# Patient Record
Sex: Female | Born: 1945
Health system: Southern US, Community
[De-identification: ages and names within clinical notes are randomized; demographics above are authoritative.]

## PROBLEM LIST (undated history)

## (undated) DIAGNOSIS — U071 COVID-19: Secondary | ICD-10-CM

## (undated) DIAGNOSIS — I639 Cerebral infarction, unspecified: Secondary | ICD-10-CM

## (undated) DIAGNOSIS — Z8719 Personal history of other diseases of the digestive system: Secondary | ICD-10-CM

## (undated) DIAGNOSIS — E785 Hyperlipidemia, unspecified: Secondary | ICD-10-CM

## (undated) DIAGNOSIS — E119 Type 2 diabetes mellitus without complications: Secondary | ICD-10-CM

## (undated) DIAGNOSIS — I5030 Unspecified diastolic (congestive) heart failure: Secondary | ICD-10-CM

## (undated) HISTORY — DX: Personal history of other diseases of the digestive system: Z87.19

## (undated) HISTORY — DX: Type 2 diabetes mellitus without complications: E11.9

## (undated) HISTORY — DX: Cerebral infarction, unspecified: I63.9

## (undated) HISTORY — DX: COVID-19: U07.1

## (undated) HISTORY — DX: Unspecified diastolic (congestive) heart failure: I50.30

## (undated) HISTORY — PX: TUBAL LIGATION: SHX77

## (undated) HISTORY — DX: Hyperlipidemia, unspecified: E78.5

---

## 2015-10-16 ENCOUNTER — Encounter (HOSPITAL_COMMUNITY): Payer: Self-pay | Admitting: *Deleted

## 2015-10-16 ENCOUNTER — Emergency Department (HOSPITAL_COMMUNITY)
Admission: EM | Admit: 2015-10-16 | Discharge: 2015-10-17 | Disposition: A | Discharge status: Home / Self Care | Payer: Medicare Other | Attending: Emergency Medicine | Admitting: Emergency Medicine

## 2015-10-16 ENCOUNTER — Emergency Department (HOSPITAL_COMMUNITY): Payer: Medicare Other

## 2015-10-16 DIAGNOSIS — Z8739 Personal history of other diseases of the musculoskeletal system and connective tissue: Secondary | ICD-10-CM | POA: Insufficient documentation

## 2015-10-16 DIAGNOSIS — R102 Pelvic and perineal pain: Secondary | ICD-10-CM | POA: Diagnosis present

## 2015-10-16 DIAGNOSIS — R1031 Right lower quadrant pain: Secondary | ICD-10-CM | POA: Insufficient documentation

## 2015-10-16 LAB — CBC
HEMATOCRIT: 36.4 % (ref 36.0–46.0)
Hemoglobin: 12 g/dL (ref 12.0–15.0)
MCH: 25.7 pg — ABNORMAL LOW (ref 26.0–34.0)
MCHC: 33 g/dL (ref 30.0–36.0)
MCV: 77.9 fL — AB (ref 78.0–100.0)
PLATELETS: 284 10*3/uL (ref 150–400)
RBC: 4.67 MIL/uL (ref 3.87–5.11)
RDW: 14.7 % (ref 11.5–15.5)
WBC: 6.7 10*3/uL (ref 4.0–10.5)

## 2015-10-16 LAB — I-STAT CHEM 8, ED
BUN: 16 mg/dL (ref 6–20)
CHLORIDE: 103 mmol/L (ref 101–111)
CREATININE: 1 mg/dL (ref 0.44–1.00)
Calcium, Ion: 1.19 mmol/L (ref 1.13–1.30)
GLUCOSE: 167 mg/dL — AB (ref 65–99)
HEMATOCRIT: 39 % (ref 36.0–46.0)
HEMOGLOBIN: 13.3 g/dL (ref 12.0–15.0)
POTASSIUM: 3.8 mmol/L (ref 3.5–5.1)
Sodium: 140 mmol/L (ref 135–145)
TCO2: 27 mmol/L (ref 0–100)

## 2015-10-16 LAB — URINALYSIS, ROUTINE W REFLEX MICROSCOPIC
BILIRUBIN URINE: NEGATIVE
GLUCOSE, UA: NEGATIVE mg/dL
HGB URINE DIPSTICK: NEGATIVE
KETONES UR: NEGATIVE mg/dL
Nitrite: NEGATIVE
PH: 5.5 (ref 5.0–8.0)
PROTEIN: NEGATIVE mg/dL
Specific Gravity, Urine: 1.025 (ref 1.005–1.030)

## 2015-10-16 LAB — BASIC METABOLIC PANEL
Anion gap: 8 (ref 5–15)
BUN: 14 mg/dL (ref 6–20)
CALCIUM: 8.9 mg/dL (ref 8.9–10.3)
CO2: 26 mmol/L (ref 22–32)
CREATININE: 0.91 mg/dL (ref 0.44–1.00)
Chloride: 104 mmol/L (ref 101–111)
GFR calc Af Amer: >60 mL/min (ref >60)
GLUCOSE: 171 mg/dL — AB (ref 65–99)
Potassium: 3.8 mmol/L (ref 3.5–5.1)
Sodium: 138 mmol/L (ref 135–145)

## 2015-10-16 LAB — URINE MICROSCOPIC-ADD ON: RBC / HPF: NONE SEEN RBC/hpf (ref 0–5)

## 2015-10-16 MED ORDER — IOPAMIDOL (ISOVUE-300) INJECTION 61%
INTRAVENOUS | Status: AC
  Administered 2015-10-16: 100 mL
  Filled 2015-10-16: qty 100

## 2015-10-16 NOTE — ED Notes (Addendum)
Pt here for pelvic pain x over a year.  No recent chang in this, no vaginal bleeding.

## 2015-10-17 DIAGNOSIS — R1031 Right lower quadrant pain: Secondary | ICD-10-CM | POA: Diagnosis not present

## 2015-10-17 NOTE — ED Provider Notes (Signed)
CSN: [id number]     Arrival date & time 10/16/15  1554 History   First MD Initiated Contact with Patient 10/16/15 2104     Chief Complaint  Patient presents with  . Pelvic Pain     (Consider location/radiation/quality/duration/timing/severity/associated sxs/prior Treatment) Patient is a 70 y.o. female presenting with pelvic pain.  Pelvic Pain Associated symptoms include abdominal pain. Pertinent negatives include no chest pain, fatigue, fever or nausea.  70 year old female presents complaining of right lower abdominal/hip pain. She says this pain has been ongoing for many years, but was worse today. She thinks it's related to helping her daughter move the other day. She denies any vaginal discharge, no dysuria, no generalized abdominal pain. She has had no constipation or diarrhea. She has had no blood in her stools. She has had no fever. She does say that she has pain in her right lower quadrant, worse with palpation. She says it is never hurt this badly before. 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  History reviewed. No pertinent past medical history. History reviewed. No pertinent past surgical history. No family history on file. Social History  Substance Use Topics  . Smoking status: Never Smoker   . Smokeless tobacco: None  . Alcohol Use: No   OB History    No data available     Review of Systems  Constitutional: Negative for fever and fatigue.  Respiratory: Negative for shortness of breath.   Cardiovascular: Negative for chest pain.  Gastrointestinal: Positive for abdominal pain. Negative for nausea, diarrhea, constipation, blood in stool and anal bleeding.  Genitourinary: Positive for pelvic pain. Negative for dysuria and flank pain.  All other systems reviewed and are negative.     Allergies  Review of patient's allergies indicates no  known allergies.  Home Medications   Prior to Admission medications   Not on File   BP 138/79 mmHg  Pulse 72  Temp(Src) 98.6 F (37 C) (Oral)  Resp 17  Wt 79.878 kg  SpO2 98% Physical Exam  Constitutional: She is oriented to person, place, and time. She appears well-developed and well-nourished. No distress.  HENT:  Head: Normocephalic and atraumatic.  Eyes: Conjunctivae are normal. Pupils are equal, round, and reactive to light.  Neck: Normal range of motion. Neck supple. No JVD present.  Cardiovascular: Regular rhythm.   Pulmonary/Chest: Effort normal and breath sounds normal. No respiratory distress. She has no rales.  Abdominal: Soft. Bowel sounds are normal. There is tenderness (right lower quadrant). There is no rebound and no guarding.  Musculoskeletal: She exhibits no edema.  Neurological: She is alert and oriented to person, place, and time. No cranial nerve deficit.  Skin: Skin is warm and dry.  Nursing note and vitals reviewed.   ED Course  Procedures (including critical care time) Labs Review Labs Reviewed  CBC - Abnormal; Notable for the following:    MCV 77.9 (*)    MCH 25.7 (*)    All other components within normal limits  BASIC METABOLIC PANEL - Abnormal; Notable for the following:    Glucose, Bld 171 (*)    All other components within normal limits  URINALYSIS, ROUTINE W REFLEX MICROSCOPIC (NOT AT [facility]) - Abnormal; Notable for the following:    APPearance CLOUDY (*)    Leukocytes, UA MODERATE (*)    All other components within normal limits  URINE MICROSCOPIC-ADD ON - Abnormal; Notable for the following:    Squamous Epithelial / LPF 0-5 (*)    Bacteria, UA RARE (*)    All other components within normal limits  I-STAT CHEM 8, ED - Abnormal; Notable for the following:    Glucose, Bld 167 (*)    All other components within normal limits    Imaging Review Ct Abdomen Pelvis W Contrast  10/17/2015  CLINICAL DATA:  Right lower quadrant pain. Pelvic  pain for over a year. EXAM: CT ABDOMEN AND PELVIS WITH CONTRAST TECHNIQUE: Multidetector CT imaging of the abdomen and pelvis was performed using the standard protocol following bolus administration of intravenous contrast. CONTRAST:  175mL ISOVUE-300 IOPAMIDOL (ISOVUE-300) INJECTION 61% COMPARISON:  None. FINDINGS: Mild atelectasis in the lung bases. The liver, spleen, gallbladder, pancreas, adrenal glands, kidneys, abdominal aorta, inferior vena cava, and retroperitoneal lymph nodes are unremarkable. Stomach and small bowel are decompressed. Scattered stool in the colon without abnormal distention. No free air or free fluid in the abdomen. Pelvis: Appendix is normal. Uterus and ovaries are not enlarged. Bladder wall is not thickened. No free or loculated fluid collections. No pelvic mass or lymphadenopathy. Degenerative changes in the spine and hips. No destructive bone lesions. IMPRESSION: No acute process demonstrated in the abdomen or pelvis. Electronically Signed   By: Lucienne Capers M.D.   On: 10/17/2015 00:49   I have personally reviewed and evaluated these images and lab results as part of my medical decision-making.   EKG Interpretation None      MDM   Final diagnoses:  Right lower quadrant abdominal pain    Patient presents with right lower quadrant abdominal pain and tenderness. Concern for acute abdominal emergency such as appendicitis, though this is less likely given her history. Lab work reviewed, no leukocytosis, transaminitis, metabolic acidosis to suggest intra-abdominal processes. CT obtained and again does not demonstrate any intra-abdominal emergency. Due to chronicity of the pain, low suspicion for serious emergency at this point. Encouraged her to follow up with her primary care doctor. Patient is discharged home him all questions answered, comfortable with plan. Plan was also discussed with daughter.    Leata Mouse, MD 10/17/15 0100  Davonna Belling, MD 10/17/15  2234

## 2015-10-17 NOTE — Discharge Instructions (Signed)

## 2015-11-19 ENCOUNTER — Ambulatory Visit (INDEPENDENT_AMBULATORY_CARE_PROVIDER_SITE_OTHER): Payer: Medicare Other | Admitting: Internal Medicine

## 2015-11-19 ENCOUNTER — Encounter: Payer: Self-pay | Admitting: Internal Medicine

## 2015-11-19 ENCOUNTER — Ambulatory Visit (INDEPENDENT_AMBULATORY_CARE_PROVIDER_SITE_OTHER)
Admission: RE | Admit: 2015-11-19 | Discharge: 2015-11-19 | Disposition: A | Discharge status: Home / Self Care | Payer: Medicare Other | Source: Ambulatory Visit | Attending: Internal Medicine | Admitting: Internal Medicine

## 2015-11-19 VITALS — BP 122/88 | HR 80 | Ht 62.0 in | Wt 176.2 lb

## 2015-11-19 DIAGNOSIS — R0789 Other chest pain: Secondary | ICD-10-CM

## 2015-11-19 DIAGNOSIS — J9811 Atelectasis: Secondary | ICD-10-CM | POA: Diagnosis not present

## 2015-11-19 DIAGNOSIS — E119 Type 2 diabetes mellitus without complications: Secondary | ICD-10-CM

## 2015-11-19 DIAGNOSIS — R079 Chest pain, unspecified: Secondary | ICD-10-CM

## 2015-11-19 DIAGNOSIS — R05 Cough: Secondary | ICD-10-CM | POA: Diagnosis not present

## 2015-11-19 HISTORY — DX: Type 2 diabetes mellitus without complications: E11.9

## 2015-11-19 NOTE — Progress Notes (Signed)
Subjective:    Patient ID: Diana Elliott, female    DOB: 19-Nov-1945,    MRN: [id number]  HPI  40 yobf never smoker with intermittent cp x spring/summer 2016 > ER  10/16/15 with only atx in bases on CT abd/pelvis so rec f/u with primary care but self referred to pulmonary clinic 11/20/2015 due to concern re dx of atx.   11/19/2015 1st [facility] office visit/ Diana Elliott   Chief Complaint  Patient presents with  . Pulmonary Consult    Self referral for abn ct abd/pelvis. Pt seen in the ED on 10/16/15 for pelvic pain and ct showed atelectasis. She c/o occ dysphagia.   RLQ pain has been present daily for over a year, resolves in supine position, ? Better with advil, assoc with dysphagia > chest tight/ better if swallows lots of water with food  ? If this was after she started taking nsaids for the RLQ pain which is worse when she's on her feet all day.   No obvious day to day or daytime variability or assoc sob or  chronic cough or exertiional or pleuritic  cp  subjective wheeze or overt sinus or hb symptoms. No unusual exp hx or h/o childhood pna/ asthma or knowledge of premature birth.  Sleeping ok without nocturnal  or early am exacerbation  of respiratory  c/o's or need for noct saba. Also denies any obvious fluctuation of symptoms with weather or environmental changes or other aggravating or alleviating factors except as outlined above   Current Medications, Allergies, Complete Past Medical History, Past Surgical History, Family History, and Social History were reviewed in [employer] record.                  Review of Systems  Constitutional: Negative for fever, chills and unexpected weight change.  HENT: Positive for trouble swallowing. Negative for congestion, dental problem, ear pain, nosebleeds, postnasal drip, rhinorrhea, sinus pressure, sneezing, sore throat and voice change.   Eyes: Negative for visual disturbance.  Respiratory: Negative for cough,  choking and shortness of breath.   Cardiovascular: Negative for chest pain and leg swelling.  Gastrointestinal: Negative for vomiting, abdominal pain and diarrhea.       Indigestion  Genitourinary: Negative for difficulty urinating.  Musculoskeletal: Negative for arthralgias.  Skin: Negative for rash.  Neurological: Negative for tremors, syncope and headaches.  Hematological: Does not bruise/bleed easily.       Objective:   Physical Exam   amb bf nad   Wt Readings from Last 3 Encounters:  11/19/15 176 lb 3.2 oz (79.924 kg)  10/16/15 176 lb 1.6 oz (79.878 kg)    Vital signs reviewed   HEENT: nl dentition, turbinates, and oropharynx. Nl external ear canals without cough reflex   NECK :  without JVD/Nodes/TM/ nl carotid upstrokes bilaterally   LUNGS: no acc muscle use,  Nl contour chest which is clear to A and P bilaterally without cough on insp or exp maneuvers   CV:  RRR  no s3 or murmur or increase in P2, no edema   ABD:  soft and nontender with nl inspiratory excursion in the supine position. No bruits or organomegaly, bowel sounds nl  MS:  Nl gait/ ext warm without deformities, calf tenderness, cyanosis or clubbing No obvious joint restrictions   SKIN: warm and dry without lesions    NEURO:  alert, approp, nl sensorium with  no motor deficits     CXR PA and Lateral:   11/19/2015 :  I personally reviewed images and agree with radiology impression as follows:   No active cardiopulmonary disease         Assessment & Plan:

## 2015-11-19 NOTE — Patient Instructions (Addendum)
Try prilosec otc 20mg   Take 30-60 min before first meal of the day and Pepcid ac (famotidine) 20 mg one @  bedtime for a month to see if helps and if not you will need to see a GI doctor call for referral)  Treatment also consists of avoiding foods that cause gas (especially [place] food, boiled eggs, beans and raw vegetables like spinach and salads)  and citrucel 1 heaping tsp twice daily with a large glass of water.  Pain should improve w/in 2 weeks and if not then consider further GI work up.      GERD (REFLUX)  is an extremely common cause of respiratory symptoms just like yours , many times with no obvious heartburn at all.    It can be treated with medication, but also with lifestyle changes including elevation of the head of your bed (ideally with 6 inch  bed blocks),  Smoking cessation, avoidance of late meals, excessive alcohol, and avoid fatty foods, chocolate, peppermint, colas, red wine, and acidic juices such as orange juice.  NO MINT OR MENTHOL PRODUCTS SO NO COUGH DROPS  USE SUGARLESS CANDY INSTEAD (Jolley ranchers or Stover's or Life Savers) or even ice chips will also do - the key is to swallow to prevent all throat clearing. NO OIL BASED VITAMINS - use powdered substitutes.   Try tylenol for your abdominal pain and then advil if needed but take with meals   Please remember to go to the  x-ray department downstairs for your tests - we will call you with the results when they are available.    Please see patient coordinator before you leave today  to schedule internal medicine referral

## 2015-11-20 DIAGNOSIS — J9811 Atelectasis: Secondary | ICD-10-CM | POA: Insufficient documentation

## 2015-11-20 NOTE — Assessment & Plan Note (Signed)
Probably related to mod obesity with Body mass index is 32.22 > referred to primary care

## 2015-11-20 NOTE — Assessment & Plan Note (Addendum)
Assoc with dysphagia and may be result of chronic use of nsaids or related to IBS so rec trial of ppi q am and h2hs / gerd and gas diet and f/u prn with GI pending establishing with primary care  Nothing at all on hx to suggest IHD as does not related to activity but rather to meals and not lateralizing or pleuritic to suggest a lung problem but certainly if does not resolve with rx as per avs will need further w/u  Discussed in detail all the  indications, usual  risks and alternatives  relative to the benefits with patient/daughter who agree  to proceed with conservative f/u as outlined in the AVS

## 2015-11-20 NOTE — Assessment & Plan Note (Signed)
CT abd and present cxr reviewed and do not show significant atx which was likely just the result of lyinging in supine position in the case of the former study and does not required pulmonary f/u  Total time devoted to counseling  = 35/36m review case with pt/daughter discussion of options/alternatives/ personally creating written instructions  in presence of pt  then going over those specific  Instructions directly with the pt including how to use all of the meds but in particular covering each new medication in detail and the difference between the maintenance/automatic meds and the prns using an action plan format for the latter.

## 2015-12-06 DIAGNOSIS — Z1231 Encounter for screening mammogram for malignant neoplasm of breast: Secondary | ICD-10-CM | POA: Diagnosis not present

## 2015-12-06 DIAGNOSIS — Z124 Encounter for screening for malignant neoplasm of cervix: Secondary | ICD-10-CM | POA: Diagnosis not present

## 2015-12-07 ENCOUNTER — Other Ambulatory Visit: Payer: Self-pay | Admitting: Obstetrics & Gynecology

## 2015-12-07 DIAGNOSIS — R928 Other abnormal and inconclusive findings on diagnostic imaging of breast: Secondary | ICD-10-CM

## 2015-12-14 ENCOUNTER — Other Ambulatory Visit: Payer: Medicare Other

## 2015-12-17 ENCOUNTER — Ambulatory Visit
Admission: RE | Admit: 2015-12-17 | Discharge: 2015-12-17 | Disposition: A | Discharge status: Home / Self Care | Payer: Medicare Other | Source: Ambulatory Visit | Attending: Obstetrics & Gynecology | Admitting: Obstetrics & Gynecology

## 2015-12-17 DIAGNOSIS — R928 Other abnormal and inconclusive findings on diagnostic imaging of breast: Secondary | ICD-10-CM

## 2015-12-17 DIAGNOSIS — N6489 Other specified disorders of breast: Secondary | ICD-10-CM | POA: Diagnosis not present

## 2015-12-17 DIAGNOSIS — N63 Unspecified lump in breast: Secondary | ICD-10-CM | POA: Diagnosis not present

## 2016-01-30 ENCOUNTER — Ambulatory Visit: Payer: Medicare Other | Admitting: Internal Medicine

## 2017-03-16 DIAGNOSIS — Z1231 Encounter for screening mammogram for malignant neoplasm of breast: Secondary | ICD-10-CM | POA: Diagnosis not present

## 2017-03-16 DIAGNOSIS — Z124 Encounter for screening for malignant neoplasm of cervix: Secondary | ICD-10-CM | POA: Diagnosis not present

## 2017-03-19 ENCOUNTER — Other Ambulatory Visit: Payer: Self-pay | Admitting: Obstetrics & Gynecology

## 2017-03-19 DIAGNOSIS — E2839 Other primary ovarian failure: Secondary | ICD-10-CM

## 2018-05-17 DIAGNOSIS — Z1231 Encounter for screening mammogram for malignant neoplasm of breast: Secondary | ICD-10-CM | POA: Diagnosis not present

## 2018-05-17 DIAGNOSIS — Z124 Encounter for screening for malignant neoplasm of cervix: Secondary | ICD-10-CM | POA: Diagnosis not present

## 2018-05-17 DIAGNOSIS — Z01419 Encounter for gynecological examination (general) (routine) without abnormal findings: Secondary | ICD-10-CM | POA: Diagnosis not present

## 2018-05-21 DIAGNOSIS — F419 Anxiety disorder, unspecified: Secondary | ICD-10-CM | POA: Diagnosis not present

## 2018-05-21 DIAGNOSIS — E785 Hyperlipidemia, unspecified: Secondary | ICD-10-CM | POA: Diagnosis not present

## 2018-05-21 DIAGNOSIS — R7309 Other abnormal glucose: Secondary | ICD-10-CM | POA: Diagnosis not present

## 2018-05-21 DIAGNOSIS — J309 Allergic rhinitis, unspecified: Secondary | ICD-10-CM | POA: Diagnosis not present

## 2018-09-14 ENCOUNTER — Encounter: Payer: Self-pay | Admitting: Gastroenterology

## 2019-07-06 ENCOUNTER — Other Ambulatory Visit (INDEPENDENT_AMBULATORY_CARE_PROVIDER_SITE_OTHER): Payer: Medicare Other

## 2019-07-06 ENCOUNTER — Ambulatory Visit (INDEPENDENT_AMBULATORY_CARE_PROVIDER_SITE_OTHER): Payer: Medicare Other | Admitting: Gastroenterology

## 2019-07-06 ENCOUNTER — Encounter: Payer: Self-pay | Admitting: Gastroenterology

## 2019-07-06 VITALS — BP 132/80 | HR 72 | Temp 97.4°F | Ht 62.0 in | Wt 169.8 lb

## 2019-07-06 DIAGNOSIS — R634 Abnormal weight loss: Secondary | ICD-10-CM

## 2019-07-06 DIAGNOSIS — K625 Hemorrhage of anus and rectum: Secondary | ICD-10-CM

## 2019-07-06 DIAGNOSIS — R630 Anorexia: Secondary | ICD-10-CM

## 2019-07-06 DIAGNOSIS — Z1159 Encounter for screening for other viral diseases: Secondary | ICD-10-CM | POA: Diagnosis not present

## 2019-07-06 DIAGNOSIS — R109 Unspecified abdominal pain: Secondary | ICD-10-CM | POA: Diagnosis not present

## 2019-07-06 DIAGNOSIS — K5900 Constipation, unspecified: Secondary | ICD-10-CM | POA: Diagnosis not present

## 2019-07-06 DIAGNOSIS — K602 Anal fissure, unspecified: Secondary | ICD-10-CM

## 2019-07-06 LAB — CBC WITH DIFFERENTIAL/PLATELET
Basophils Absolute: 0.1 10*3/uL (ref 0.0–0.1)
Basophils Relative: 0.9 % (ref 0.0–3.0)
Eosinophils Absolute: 0.2 10*3/uL (ref 0.0–0.7)
Eosinophils Relative: 2.7 % (ref 0.0–5.0)
HCT: 39.1 % (ref 36.0–46.0)
Hemoglobin: 12.5 g/dL (ref 12.0–15.0)
Lymphocytes Relative: 23.6 % (ref 12.0–46.0)
Lymphs Abs: 1.3 10*3/uL (ref 0.7–4.0)
MCHC: 32.1 g/dL (ref 30.0–36.0)
MCV: 78.6 fl (ref 78.0–100.0)
Monocytes Absolute: 0.4 10*3/uL (ref 0.1–1.0)
Monocytes Relative: 7.3 % (ref 3.0–12.0)
Neutro Abs: 3.7 10*3/uL (ref 1.4–7.7)
Neutrophils Relative %: 65.5 % (ref 43.0–77.0)
Platelets: 320 10*3/uL (ref 150.0–400.0)
RBC: 4.97 Mil/uL (ref 3.87–5.11)
RDW: 14.9 % (ref 11.5–15.5)
WBC: 5.7 10*3/uL (ref 4.0–10.5)

## 2019-07-06 LAB — BASIC METABOLIC PANEL
BUN: 10 mg/dL (ref 6–23)
CO2: 29 mEq/L (ref 19–32)
Calcium: 9.2 mg/dL (ref 8.4–10.5)
Chloride: 103 mEq/L (ref 96–112)
Creatinine, Ser: 0.82 mg/dL (ref 0.40–1.20)
GFR: 82.49 mL/min (ref >60.00)
Glucose, Bld: 198 mg/dL — ABNORMAL HIGH (ref 70–99)
Potassium: 3.8 mEq/L (ref 3.5–5.1)
Sodium: 138 mEq/L (ref 135–145)

## 2019-07-06 MED ORDER — AMBULATORY NON FORMULARY MEDICATION: 1 refills | Status: AC

## 2019-07-06 MED ORDER — SUPREP BOWEL PREP KIT 17.5-3.13-1.6 GM/177ML PO SOLN: ORAL | 0 refills | Status: DC

## 2019-07-06 NOTE — Patient Instructions (Addendum)
If you are age 74 or older, your body mass index should be between 23-30. Your Body mass index is 31.06 kg/m. If this is out of the aforementioned range listed, please consider follow up with your Primary Care Provider.  If you are age 61 or younger, your body mass index should be between 19-25. Your Body mass index is 31.06 kg/m. If this is out of the aformentioned range listed, please consider follow up with your Primary Care Provider.   Go to the basement today for labs    You have been scheduled for a colonoscopy. Please follow written instructions given to you at your visit today.  Please pick up your prep supplies at the pharmacy within the next 1-3 days. If you use inhalers (even only as needed), please bring them with you on the day of your procedure.   We have sent a prescription for nitroglycerin 0.125% gel to [facility]. You should apply a pea size amount to your rectum three times daily x 6-8 weeks.  [facility]'s information is below: Address: [address] [place], [place] [address]  [phone]  *Please DO NOT go directly from our office to pick up this medication! Give the pharmacy 1 day to process the prescription as this is compounded and takes time to make.  Increase water intake to 8 cups per day   I appreciate the  opportunity to care for you  Thank You   Harl Bowie , MD

## 2019-07-06 NOTE — Progress Notes (Signed)
Diana Elliott    [id number]    1946/04/29  Primary Care Physician:Patient, No Pcp Per  Referring Physician: Dr Genia Harold   Chief complaint: Hemorrhoids, constipation  HPI:  74 yr F here for new patient visit with complaints of worsening constipation in the past 1 to 2 months associated with intermittent rectal bleeding.  She she is having increased blood per rectum since last Friday and is seeing blood with every bowel movement.  Also complains of lower abdominal pain.  She is currently not on any laxatives, has tried over-the-counter stool softener with no improvement.  She has to strain excessively and has difficulty evacuating.  On average 1-2 bowel movements a week.  Denies any change in diet or new medications.  No family history of colon cancer or IBD. She started taking ibuprofen as needed for lower abdominal pain but since she developed increased rectal bleeding she has discontinued it.  Colonoscopy in 2005, [place] per patient, report is not available to review during this visit.  Patient does not recall if she ever had polyps or not   CT abdomen and pelvis with contrast: 10/16/2015 Unremarkable   Outpatient Encounter Medications as of 07/06/2019  Medication Sig  . b complex vitamins tablet Take 1 tablet by mouth daily.  Marland Kitchen BLACK CURRANT SEED OIL PO Take 1 capsule by mouth daily.  . Cholecalciferol (VITAMIN D PO) Take 1 tablet by mouth daily.  . Omega-3 Fatty Acids (OMEGA 3 PO) Take 1 capsule by mouth daily.  . TURMERIC PO Take 1 capsule by mouth daily.  . AMBULATORY NON FORMULARY MEDICATION Medication Name: Nitroglycerin ointment 0.125% use pea sized amount 4 times a day per rectum  I  . Na Sulfate-K Sulfate-Mg Sulf (SUPREP BOWEL PREP KIT) 17.5-3.13-1.6 GM/177ML SOLN 1 prep kit   No facility-administered encounter medications on file as of 07/06/2019.    Allergies as of 07/06/2019  . (No Known Allergies)    History reviewed. No pertinent past  medical history.  Past Surgical History:  Procedure Laterality Date  . TUBAL LIGATION      History reviewed. No pertinent family history.  Social History   Socioeconomic History  . Marital status: Widowed    Spouse name: Not on file  . Number of children: Not on file  . Years of education: Not on file  . Highest education level: Not on file  Occupational History  . Not on file  Tobacco Use  . Smoking status: Never Smoker  . Smokeless tobacco: Never Used  Substance and Sexual Activity  . Alcohol use: No    Alcohol/week: 0.0 standard drinks  . Drug use: No  . Sexual activity: Not on file  Other Topics Concern  . Not on file  Social History Narrative  . Not on file   Social Determinants of Health   Financial Resource Strain:   . Difficulty of Paying Living Expenses: Not on file  Food Insecurity:   . Worried About Charity fundraiser in the Last Year: Not on file  . Ran Out of Food in the Last Year: Not on file  Transportation Needs:   . Lack of Transportation (Medical): Not on file  . Lack of Transportation (Non-Medical): Not on file  Physical Activity:   . Days of Exercise per Week: Not on file  . Minutes of Exercise per Session: Not on file  Stress:   . Feeling of Stress : Not on file  Social Connections:   . Frequency of Communication with Friends and Family: Not on file  . Frequency of Social Gatherings with Friends and Family: Not on file  . Attends Religious Services: Not on file  . Active Member of Clubs or Organizations: Not on file  . Attends Archivist Meetings: Not on file  . Marital Status: Not on file  Intimate Partner Violence:   . Fear of Current or Ex-Partner: Not on file  . Emotionally Abused: Not on file  . Physically Abused: Not on file  . Sexually Abused: Not on file      Review of systems: Review of Systems  Constitutional: Negative for fever and chills.  Positive for weight loss and decreased appetite HENT: Negative.     Eyes: Negative for blurred vision.  Respiratory: Negative for cough, shortness of breath and wheezing.   Cardiovascular: Negative for chest pain and palpitations.  Gastrointestinal: as per HPI Genitourinary: Negative for dysuria, urgency, frequency and hematuria.  Musculoskeletal: Positive for myalgias, back pain and joint pain.  Skin: Negative for itching and rash.  Neurological: Negative for dizziness, tremors, focal weakness, seizures and loss of consciousness.  Endo/Heme/Allergies: Negative Psychiatric/Behavioral: Negative for depression, suicidal ideas and hallucinations.  All other systems reviewed and are negative.   Physical Exam: Vitals:   07/06/19 1105  BP: 132/80  Pulse: 72  Temp: (!) 97.4 F (36.3 C)   Body mass index is 31.06 kg/m. Gen:      No acute distress HEENT:  EOMI, sclera anicteric Neck:     No masses; no thyromegaly Lungs:    Clear to auscultation bilaterally; normal respiratory effort CV:         Regular rate and rhythm; no murmurs Abd:      + bowel sounds; soft, non-tender; no palpable masses, no distension Ext:    No edema; adequate peripheral perfusion Skin:      Warm and dry; no rash Neuro: alert and oriented x 3 Psych: normal mood and affect Rectal exam: Normal anal sphincter tone, +anal fissure with tenderness in posterior position.  No external hemorrhoids   Data Reviewed:  Reviewed labs, radiology imaging, old records and pertinent past GI work up   Assessment and Plan/Recommendations:  74 year old female with type 2 diabetes here for evaluation of recent onset worsening constipation and rectal bleeding  Small posterior anal fissure on rectal exam Start MiraLAX 1 capful daily Nitroglycerin 0.125% per rectum, small pea-sized amount 3-4 times a day for 6 to 8 weeks  Given she is past due for colorectal cancer screening and recent significant change in bowel habits associated with weight loss and decreased appetite, will proceed with  colonoscopy to further evaluate and exclude neoplastic lesion  Increase dietary fiber and fluid intake  Return after colonoscopy    K. Denzil Magnuson , MD    CC: No ref. provider found

## 2019-07-07 ENCOUNTER — Ambulatory Visit (INDEPENDENT_AMBULATORY_CARE_PROVIDER_SITE_OTHER): Payer: Medicare Other

## 2019-07-07 ENCOUNTER — Other Ambulatory Visit: Payer: Self-pay | Admitting: Gastroenterology

## 2019-07-07 DIAGNOSIS — Z1159 Encounter for screening for other viral diseases: Secondary | ICD-10-CM

## 2019-07-08 LAB — SARS CORONAVIRUS 2 (TAT 6-24 HRS): SARS Coronavirus 2: NEGATIVE

## 2019-07-11 ENCOUNTER — Ambulatory Visit (AMBULATORY_SURGERY_CENTER): Payer: Medicare Other | Admitting: Gastroenterology

## 2019-07-11 ENCOUNTER — Other Ambulatory Visit: Payer: Self-pay

## 2019-07-11 ENCOUNTER — Encounter: Payer: Self-pay | Admitting: Gastroenterology

## 2019-07-11 VITALS — BP 157/87 | HR 75 | Temp 98.4°F | Resp 17 | Ht 62.0 in | Wt 169.0 lb

## 2019-07-11 DIAGNOSIS — D124 Benign neoplasm of descending colon: Secondary | ICD-10-CM | POA: Diagnosis not present

## 2019-07-11 DIAGNOSIS — K625 Hemorrhage of anus and rectum: Secondary | ICD-10-CM

## 2019-07-11 DIAGNOSIS — K5730 Diverticulosis of large intestine without perforation or abscess without bleeding: Secondary | ICD-10-CM | POA: Diagnosis not present

## 2019-07-11 DIAGNOSIS — D120 Benign neoplasm of cecum: Secondary | ICD-10-CM | POA: Diagnosis not present

## 2019-07-11 DIAGNOSIS — K602 Anal fissure, unspecified: Secondary | ICD-10-CM | POA: Diagnosis not present

## 2019-07-11 MED ORDER — SODIUM CHLORIDE 0.9 % IV SOLN: 500.0000 mL | Freq: Once | INTRAVENOUS | Status: DC

## 2019-07-11 MED ORDER — HYDROCORTISONE ACETATE 25 MG RE SUPP: 25.0000 mg | RECTAL | 0 refills | Status: AC

## 2019-07-11 NOTE — Op Note (Signed)
[place] Patient Name: Diana Elliott Procedure Date: 07/11/2019 1:14 PM MRN: [id number] Endoscopist: Mauri Pole , MD Age: 74 Referring MD:  Date of Birth: [birth year] Gender: Female Account #: [id number] Procedure:                Colonoscopy Indications:              Evaluation of unexplained GI bleeding presenting                            with Hematochezia Medicines:                Monitored Anesthesia Care Procedure:                Pre-Anesthesia Assessment:                           - Prior to the procedure, a History and Physical                            was performed, and patient medications and                            allergies were reviewed. The patient's tolerance of                            previous anesthesia was also reviewed. The risks                            and benefits of the procedure and the sedation                            options and risks were discussed with the patient.                            All questions were answered, and informed consent                            was obtained. Prior Anticoagulants: The patient has                            taken no previous anticoagulant or antiplatelet                            agents. ASA Grade Assessment: II - A patient with                            mild systemic disease. After reviewing the risks                            and benefits, the patient was deemed in                            satisfactory condition to undergo the procedure.  After obtaining informed consent, the colonoscope                            was passed under direct vision. Throughout the                            procedure, the patient's blood pressure, pulse, and                            oxygen saturations were monitored continuously. The                            Colonoscope was introduced through the anus and                            advanced to the the cecum, identified  by                            appendiceal orifice and ileocecal valve. The                            colonoscopy was performed without difficulty. The                            patient tolerated the procedure well. The quality                            of the bowel preparation was excellent. The                            ileocecal valve, appendiceal orifice, and rectum                            were photographed. Scope In: 1:16:04 PM Scope Out: 1:31:58 PM Scope Withdrawal Time: 0 hours 12 minutes 19 seconds  Total Procedure Duration: 0 hours 15 minutes 54 seconds  Findings:                 The perianal and digital rectal examinations were                            normal.                           Two sessile polyps were found in the cecum. The                            polyps were 1 to 2 mm in size. These polyps were                            removed with a cold biopsy forceps. Resection and                            retrieval were complete.  A 7 mm polyp was found in the descending colon. The                            polyp was sessile. The polyp was removed with a                            cold snare. Resection and retrieval were complete.                           Scattered small-mouthed diverticula were found in                            the sigmoid colon and descending colon.                           An area of moderately congested erythematous mucosa                            with altered vasculature was found in the rectum.                            Biopsies were taken with a cold forceps for                            histology.                           A less than 1 mm anal fissure was found in the anal                            canal. Complications:            No immediate complications. Estimated Blood Loss:     Estimated blood loss was minimal. Impression:               - Two 1 to 2 mm polyps in the cecum, removed with a                             cold biopsy forceps. Resected and retrieved.                           - One 7 mm polyp in the descending colon, removed                            with a cold snare. Resected and retrieved.                           - Mild diverticulosis in the sigmoid colon and in                            the descending colon.                           - Congested mucosa in the rectum. Biopsied.                           -  Anal fissure. Recommendation:           - Patient has a contact number available for                            emergencies. The signs and symptoms of potential                            delayed complications were discussed with the                            patient. Return to normal activities tomorrow.                            Written discharge instructions were provided to the                            patient.                           - Resume previous diet.                           - Continue present medications.                           - Await pathology results.                           - Use hydrocortisone suppository 25 mg 1 per rectum                            once a day at bedtime for 2 weeks.                           - Return to my office in 4 weeks, next available                            appointment. Mauri Pole, MD 07/11/2019 1:38:44 PM This report has been signed electronically.

## 2019-07-11 NOTE — Patient Instructions (Signed)
Information on polyps and diverticulosis given to you today.  Await pathology results.  Use hydrocortisone suppository 25 mg, one per rectum once a day at bedtime for 2 weeks.  Return to GI clinic in 4 weeks or next available appointment.  YOU HAD AN ENDOSCOPIC PROCEDURE TODAY AT [facility]:   Refer to the procedure report that was given to you for any specific questions about what was found during the examination.  If the procedure report does not answer your questions, please call your gastroenterologist to clarify.  If you requested that your care partner not be given the details of your procedure findings, then the procedure report has been included in a sealed envelope for you to review at your convenience later.  YOU SHOULD EXPECT: Some feelings of bloating in the abdomen. Passage of more gas than usual.  Walking can help get rid of the air that was put into your GI tract during the procedure and reduce the bloating. If you had a lower endoscopy (such as a colonoscopy or flexible sigmoidoscopy) you may notice spotting of blood in your stool or on the toilet paper. If you underwent a bowel prep for your procedure, you may not have a normal bowel movement for a few days.  Please Note:  You might notice some irritation and congestion in your nose or some drainage.  This is from the oxygen used during your procedure.  There is no need for concern and it should clear up in a day or so.  SYMPTOMS TO REPORT IMMEDIATELY:   Following lower endoscopy (colonoscopy or flexible sigmoidoscopy):  Excessive amounts of blood in the stool  Significant tenderness or worsening of abdominal pains  Swelling of the abdomen that is new, acute  Fever of 100F or higher    For urgent or emergent issues, a gastroenterologist can be reached at any hour by calling [phone].   DIET:  We do recommend a small meal at first, but then you may proceed to your regular diet.  Drink plenty of  fluids but you should avoid alcoholic beverages for 24 hours.  ACTIVITY:  You should plan to take it easy for the rest of today and you should NOT DRIVE or use heavy machinery until tomorrow (because of the sedation medicines used during the test).    FOLLOW UP: Our staff will call the number listed on your records 48-72 hours following your procedure to check on you and address any questions or concerns that you may have regarding the information given to you following your procedure. If we do not reach you, we will leave a message.  We will attempt to reach you two times.  During this call, we will ask if you have developed any symptoms of COVID 19. If you develop any symptoms (ie: fever, flu-like symptoms, shortness of breath, cough etc.) before then, please call [phone].  If you test positive for Covid 19 in the 2 weeks post procedure, please call and report this information to [place].    If any biopsies were taken you will be contacted by phone or by letter within the next 1-3 weeks.  Please call us at [phone] if you have not heard about the biopsies in 3 weeks.    SIGNATURES/CONFIDENTIALITY: You and/or your care partner have signed paperwork which will be entered into your electronic medical record.  These signatures attest to the fact that that the information above on your After Visit Summary has been reviewed and is  understood.  Full responsibility of the confidentiality of this discharge information lies with you and/or your care-partner.

## 2019-07-11 NOTE — Progress Notes (Signed)
Called to room to assist during endoscopic procedure.  Patient ID and intended procedure confirmed with present staff. Received instructions for my participation in the procedure from the performing physician.  

## 2019-07-11 NOTE — Progress Notes (Signed)
Pt's states no medical or surgical changes since previsit or office visit.  Temp JB VS CW 

## 2019-07-11 NOTE — Progress Notes (Signed)
Report to PACU, RN, vss, BBS= Clear.  

## 2019-07-12 ENCOUNTER — Telehealth: Payer: Self-pay

## 2019-07-12 NOTE — Telephone Encounter (Signed)
Patient needs a follow up appointment with Dr Silverio Decamp. Status post colonoscopy for hematochezia. Call to patient's phone. No answer. Left a message about the reason for the call and a request she call us back to schedule the follow up appointment.

## 2019-07-13 ENCOUNTER — Telehealth: Payer: Self-pay

## 2019-07-13 NOTE — Telephone Encounter (Signed)
Called patient back and she reported same information that her daughter had previously said this morning. She just wanted to let us know that she was doing fine and would let us know if there were any issues.

## 2019-07-13 NOTE — Telephone Encounter (Signed)
Pt stated that you spoke to her daughter this morning and requested a call back to follow up.

## 2019-07-13 NOTE — Telephone Encounter (Signed)
  Follow up Call-  Call back number 07/11/2019  Post procedure Call Back phone  # [phone] [facility])  Permission to leave phone message Yes  Some recent data might be hidden     Patient questions:  Do you have a fever, pain , or abdominal swelling? No. Pain Score  0 *  Have you tolerated food without any problems? Yes.    Have you been able to return to your normal activities? Yes.    Do you have any questions about your discharge instructions: Diet   No. Medications  No. Follow up visit  No.  Do you have questions or concerns about your Care? No.  Actions: * If pain score is 4 or above: No action needed, pain <4. 1. Have you developed a fever since your procedure? no  2.   Have you had an respiratory symptoms (SOB or cough) since your procedure? no  3.   Have you tested positive for COVID 19 since your procedure no  4.   Have you had any family members/close contacts diagnosed with the COVID 19 since your procedure?  no   If yes to any of these questions please route to Joylene John, RN and Alphonsa Gin, Therapist, sports.

## 2019-07-18 ENCOUNTER — Telehealth: Payer: Self-pay | Admitting: *Deleted

## 2019-07-18 NOTE — Telephone Encounter (Signed)
Anusol suppositories are not covered under patient's insurance plan. I have left a message for patient to call back so I can advise her to purchase OTC hydrocortisone and glycerin suppositories instead.

## 2019-07-19 ENCOUNTER — Encounter: Payer: Self-pay | Admitting: Gastroenterology

## 2019-07-20 NOTE — Telephone Encounter (Signed)
Left message for patient to call back  

## 2019-07-21 NOTE — Telephone Encounter (Signed)
Patient and her daughter indicate that they purchase the suppositories in "singles" form so no need to do over the counter plan

## 2019-07-26 ENCOUNTER — Other Ambulatory Visit: Payer: Self-pay | Admitting: Obstetrics & Gynecology

## 2019-07-26 DIAGNOSIS — E2839 Other primary ovarian failure: Secondary | ICD-10-CM

## 2019-08-16 ENCOUNTER — Ambulatory Visit: Payer: Medicare Other | Admitting: Gastroenterology

## 2019-10-03 ENCOUNTER — Ambulatory Visit: Payer: Medicare Other

## 2019-10-07 ENCOUNTER — Other Ambulatory Visit: Payer: Medicare Other

## 2019-12-19 ENCOUNTER — Ambulatory Visit
Admission: RE | Admit: 2019-12-19 | Discharge: 2019-12-19 | Disposition: A | Discharge status: Home / Self Care | Payer: Medicare Other | Source: Ambulatory Visit | Attending: Obstetrics & Gynecology | Admitting: Obstetrics & Gynecology

## 2019-12-19 ENCOUNTER — Other Ambulatory Visit: Payer: Self-pay

## 2019-12-19 DIAGNOSIS — E2839 Other primary ovarian failure: Secondary | ICD-10-CM

## 2020-06-03 ENCOUNTER — Emergency Department (HOSPITAL_COMMUNITY)
Admission: EM | Admit: 2020-06-03 | Discharge: 2020-06-03 | Disposition: A | Discharge status: Home / Self Care | Payer: Medicare Other | Attending: Emergency Medicine | Admitting: Emergency Medicine

## 2020-06-03 DIAGNOSIS — H6690 Otitis media, unspecified, unspecified ear: Secondary | ICD-10-CM

## 2020-06-03 DIAGNOSIS — E119 Type 2 diabetes mellitus without complications: Secondary | ICD-10-CM | POA: Insufficient documentation

## 2020-06-03 DIAGNOSIS — H6691 Otitis media, unspecified, right ear: Secondary | ICD-10-CM | POA: Insufficient documentation

## 2020-06-03 DIAGNOSIS — H9201 Otalgia, right ear: Secondary | ICD-10-CM | POA: Diagnosis present

## 2020-06-03 MED ORDER — CEPHALEXIN 500 MG PO CAPS: 500.0000 mg | ORAL_CAPSULE | Freq: Three times a day (TID) | ORAL | 0 refills | Status: DC

## 2020-06-03 NOTE — ED Notes (Signed)
Reviewed discharge instructions with patient and daughter. Follow-up care and antibiotic reviewed. Patient and daughter verbalized understanding. Patient A&Ox4, VSS, and ambulatory with steady gait upon discharge.

## 2020-06-03 NOTE — Discharge Instructions (Addendum)
Call your primary care doctor or specialist as discussed in the next 2-3 days.   Return immediately back to the ER if:  Your symptoms worsen within the next 12-24 hours. You develop new symptoms such as new fevers, persistent vomiting, new pain, shortness of breath, or new weakness or numbness, or if you have any other concerns.  

## 2020-06-03 NOTE — ED Provider Notes (Signed)
[facility] EMERGENCY DEPARTMENT Provider Note   CSN: [id number] Arrival date & time: 06/03/20  1429     History Chief Complaint  Patient presents with  . Otalgia    Diana Elliott is a 74 y.o. female.  Presents with right ear pain.  She said she has had this pain off and on for the past year.  She gets an episode of pain perhaps once every month or so.  She has not seen an ENT for this nor has she seen a primary care doctor for this until today.  She states the pain is more significant than prior episodes.  Scribes it as a sharp and aching, nonradiating.  She denies any fever vomiting cough or diarrhea.        No past medical history on file.  Patient Active Problem List   Diagnosis Date Noted  . Atelectasis 11/20/2015  . Diabetes mellitus type 2, diet-controlled ([place]) 11/19/2015  . Chest pain of unknown etiology 11/19/2015    Past Surgical History:  Procedure Laterality Date  . TUBAL LIGATION       OB History   No obstetric history on file.     Family History  Problem Relation Age of Onset  . Colon cancer Neg Hx   . Esophageal cancer Neg Hx   . Rectal cancer Neg Hx   . Stomach cancer Neg Hx     Social History   Tobacco Use  . Smoking status: Never Smoker  . Smokeless tobacco: Never Used  Vaping Use  . Vaping Use: Never used  Substance Use Topics  . Alcohol use: No    Alcohol/week: 0.0 standard drinks  . Drug use: No    Home Medications Prior to Admission medications   Medication Sig Start Date End Date Taking? Authorizing Provider  AMBULATORY NON FORMULARY MEDICATION Medication Name: Nitroglycerin ointment 0.125% use pea sized amount 4 times a day per rectum  I 07/06/19   Mauri Pole, MD  b complex vitamins tablet Take 1 tablet by mouth daily.    [provider]  BLACK CURRANT SEED OIL PO Take 1 capsule by mouth daily.    [provider]  cephALEXin (KEFLEX) 500 MG capsule Take 1 capsule (500 mg total)  by mouth 3 (three) times daily. 06/03/20   Luna Fuse, MD  Cholecalciferol (VITAMIN D PO) Take 1 tablet by mouth daily.    [provider]  Omega-3 Fatty Acids (OMEGA 3 PO) Take 1 capsule by mouth daily.    [provider]  TURMERIC PO Take 1 capsule by mouth daily.    [provider]    Allergies    Patient has no known allergies.  Review of Systems   Review of Systems  Constitutional: Negative for fever.  HENT: Positive for ear pain.   Eyes: Negative for pain.  Respiratory: Negative for cough.   Cardiovascular: Negative for chest pain.  Gastrointestinal: Negative for abdominal pain.  Genitourinary: Negative for flank pain.  Musculoskeletal: Negative for back pain.  Skin: Negative for rash.  Neurological: Negative for headaches.    Physical Exam Updated Vital Signs BP 112/81   Pulse 81   Temp 98.2 F (36.8 C) (Oral)   Resp 16   Ht 5\' 2"  (1.575 m)   Wt 81.6 kg   SpO2 98%   BMI 32.92 kg/m   Physical Exam Constitutional:      General: She is not in acute distress.    Appearance: Normal appearance.  HENT:     Head: Normocephalic.     Right Ear: Ear canal and external ear normal.     Left Ear: Tympanic membrane, ear canal and external ear normal.     Ears:     Comments: Mild to moderate erythema of the right tympanic membrane.  No mastoid bogginess or tenderness noted.  No pain with manipulation of the tragus.    Nose: Nose normal.  Eyes:     Extraocular Movements: Extraocular movements intact.  Cardiovascular:     Rate and Rhythm: Normal rate.  Pulmonary:     Effort: Pulmonary effort is normal.  Abdominal:     Palpations: Abdomen is soft.     Tenderness: There is no abdominal tenderness.  Musculoskeletal:        General: Normal range of motion.     Cervical back: Normal range of motion.  Skin:    General: Skin is warm.  Neurological:     General: No focal deficit present.     Mental Status: She is alert.     ED Results /  Procedures / Treatments   Labs (all labs ordered are listed, but only abnormal results are displayed) Labs Reviewed - No data to display  EKG None  Radiology No results found.  Procedures Procedures (including critical care time)  Medications Ordered in ED Medications - No data to display  ED Course  I have reviewed the triage vital signs and the nursing notes.  Pertinent labs & imaging results that were available during my care of the patient were reviewed by me and considered in my medical decision making (see chart for details).    MDM Rules/Calculators/A&P                          Exam patient has mild to moderate erythema of the right tympanic membrane, concern for possible otitis media.  Will give a course of Keflex for the patient.  Advise follow-up with ENT within 3 to 4 days.  Advised immediate return for fevers worsening symptoms pain or any additional concerns.   Final Clinical Impression(s) / ED Diagnoses Final diagnoses:  Acute otitis media, unspecified otitis media type    Rx / DC Orders ED Discharge Orders         Ordered    cephALEXin (KEFLEX) 500 MG capsule  3 times daily        06/03/20 1613           Luna Fuse, MD 06/03/20 [id number]

## 2020-06-03 NOTE — ED Triage Notes (Signed)
Pt. Stated, Ive had rt. Ear pain and it just throbs. I took some Ibuprofen earlier so no pain now.

## 2020-07-02 ENCOUNTER — Inpatient Hospital Stay (HOSPITAL_COMMUNITY)
Admission: EM | Admit: 2020-07-02 | Discharge: 2020-07-05 | DRG: 064 | Disposition: A | Discharge status: Home / Self Care | Payer: Medicare Other | Attending: Student in an Organized Health Care Education/Training Program | Admitting: Student in an Organized Health Care Education/Training Program

## 2020-07-02 ENCOUNTER — Other Ambulatory Visit: Payer: Self-pay

## 2020-07-02 ENCOUNTER — Encounter (HOSPITAL_COMMUNITY): Payer: Self-pay

## 2020-07-02 DIAGNOSIS — I639 Cerebral infarction, unspecified: Secondary | ICD-10-CM | POA: Diagnosis not present

## 2020-07-02 DIAGNOSIS — G8191 Hemiplegia, unspecified affecting right dominant side: Secondary | ICD-10-CM | POA: Diagnosis present

## 2020-07-02 DIAGNOSIS — I6381 Other cerebral infarction due to occlusion or stenosis of small artery: Principal | ICD-10-CM | POA: Diagnosis present

## 2020-07-02 DIAGNOSIS — R4701 Aphasia: Secondary | ICD-10-CM | POA: Diagnosis present

## 2020-07-02 DIAGNOSIS — E119 Type 2 diabetes mellitus without complications: Secondary | ICD-10-CM

## 2020-07-02 DIAGNOSIS — E785 Hyperlipidemia, unspecified: Secondary | ICD-10-CM | POA: Diagnosis present

## 2020-07-02 DIAGNOSIS — R4781 Slurred speech: Secondary | ICD-10-CM | POA: Diagnosis present

## 2020-07-02 DIAGNOSIS — R29810 Facial weakness: Secondary | ICD-10-CM | POA: Diagnosis present

## 2020-07-02 DIAGNOSIS — R739 Hyperglycemia, unspecified: Secondary | ICD-10-CM

## 2020-07-02 DIAGNOSIS — E7800 Pure hypercholesterolemia, unspecified: Secondary | ICD-10-CM | POA: Diagnosis present

## 2020-07-02 DIAGNOSIS — E1165 Type 2 diabetes mellitus with hyperglycemia: Secondary | ICD-10-CM

## 2020-07-02 DIAGNOSIS — U071 COVID-19: Secondary | ICD-10-CM

## 2020-07-02 DIAGNOSIS — I452 Bifascicular block: Secondary | ICD-10-CM | POA: Diagnosis present

## 2020-07-02 DIAGNOSIS — R471 Dysarthria and anarthria: Secondary | ICD-10-CM | POA: Diagnosis present

## 2020-07-02 DIAGNOSIS — Z803 Family history of malignant neoplasm of breast: Secondary | ICD-10-CM

## 2020-07-02 DIAGNOSIS — R29703 NIHSS score 3: Secondary | ICD-10-CM | POA: Diagnosis present

## 2020-07-02 DIAGNOSIS — I10 Essential (primary) hypertension: Secondary | ICD-10-CM | POA: Diagnosis present

## 2020-07-02 LAB — I-STAT CHEM 8, ED
BUN: 13 mg/dL (ref 8–23)
Calcium, Ion: 1.22 mmol/L (ref 1.15–1.40)
Chloride: 100 mmol/L (ref 98–111)
Creatinine, Ser: 0.9 mg/dL (ref 0.44–1.00)
Glucose, Bld: 325 mg/dL — ABNORMAL HIGH (ref 70–99)
HCT: 43 % (ref 36.0–46.0)
Hemoglobin: 14.6 g/dL (ref 12.0–15.0)
Potassium: 4 mmol/L (ref 3.5–5.1)
Sodium: 135 mmol/L (ref 135–145)
TCO2: 24 mmol/L (ref 22–32)

## 2020-07-02 LAB — CBC
HCT: 42.3 % (ref 36.0–46.0)
Hemoglobin: 13.4 g/dL (ref 12.0–15.0)
MCH: 24.9 pg — ABNORMAL LOW (ref 26.0–34.0)
MCHC: 31.7 g/dL (ref 30.0–36.0)
MCV: 78.6 fL — ABNORMAL LOW (ref 80.0–100.0)
Platelets: 289 10*3/uL (ref 150–400)
RBC: 5.38 MIL/uL — ABNORMAL HIGH (ref 3.87–5.11)
RDW: 14 % (ref 11.5–15.5)
WBC: 4.6 10*3/uL (ref 4.0–10.5)
nRBC: 0 % (ref 0.0–0.2)

## 2020-07-02 LAB — COMPREHENSIVE METABOLIC PANEL
ALT: 20 U/L (ref 0–44)
AST: 16 U/L (ref 15–41)
Albumin: 3.4 g/dL — ABNORMAL LOW (ref 3.5–5.0)
Alkaline Phosphatase: 114 U/L (ref 38–126)
Anion gap: 12 (ref 5–15)
BUN: 14 mg/dL (ref 8–23)
CO2: 23 mmol/L (ref 22–32)
Calcium: 9.4 mg/dL (ref 8.9–10.3)
Chloride: 98 mmol/L (ref 98–111)
Creatinine, Ser: 0.92 mg/dL (ref 0.44–1.00)
GFR, Estimated: >60 mL/min (ref >60)
Glucose, Bld: 325 mg/dL — ABNORMAL HIGH (ref 70–99)
Potassium: 3.9 mmol/L (ref 3.5–5.1)
Sodium: 133 mmol/L — ABNORMAL LOW (ref 135–145)
Total Bilirubin: 0.7 mg/dL (ref 0.3–1.2)
Total Protein: 7.2 g/dL (ref 6.5–8.1)

## 2020-07-02 LAB — DIFFERENTIAL
Abs Immature Granulocytes: 0.01 10*3/uL (ref 0.00–0.07)
Basophils Absolute: 0 10*3/uL (ref 0.0–0.1)
Basophils Relative: 0 %
Eosinophils Absolute: 0 10*3/uL (ref 0.0–0.5)
Eosinophils Relative: 0 %
Immature Granulocytes: 0 %
Lymphocytes Relative: 31 %
Lymphs Abs: 1.4 10*3/uL (ref 0.7–4.0)
Monocytes Absolute: 0.4 10*3/uL (ref 0.1–1.0)
Monocytes Relative: 8 %
Neutro Abs: 2.8 10*3/uL (ref 1.7–7.7)
Neutrophils Relative %: 61 %

## 2020-07-02 LAB — PROTIME-INR
INR: 1 (ref 0.8–1.2)
Prothrombin Time: 12.9 seconds (ref 11.4–15.2)

## 2020-07-02 LAB — APTT: aPTT: 29 seconds (ref 24–36)

## 2020-07-02 MED ORDER — SODIUM CHLORIDE 0.9% FLUSH: 3.0000 mL | Freq: Once | INTRAVENOUS | Status: DC

## 2020-07-02 NOTE — ED Triage Notes (Addendum)
Pt reports slurred speech and Right side numbness from her head traveling down to her foot. Pt reports she went to bed about 2200 last night and woke with her symptoms just before midnight last night but stayed home because the weather was so bad. Pt reports her symptoms started after taking metamucil. Pt with slight facial droop, Right grip weaker than Left, no arm or leg drift. LKW was 2200 07/01/2020.

## 2020-07-02 NOTE — ED Notes (Signed)
Pt LWBS. Pts daughter took her out.

## 2020-07-03 ENCOUNTER — Emergency Department (HOSPITAL_COMMUNITY): Payer: Medicare Other

## 2020-07-03 ENCOUNTER — Inpatient Hospital Stay (HOSPITAL_COMMUNITY): Payer: Medicare Other

## 2020-07-03 ENCOUNTER — Other Ambulatory Visit (HOSPITAL_COMMUNITY): Payer: Medicare Other

## 2020-07-03 DIAGNOSIS — R4701 Aphasia: Secondary | ICD-10-CM | POA: Diagnosis present

## 2020-07-03 DIAGNOSIS — I63332 Cerebral infarction due to thrombosis of left posterior cerebral artery: Secondary | ICD-10-CM | POA: Diagnosis not present

## 2020-07-03 DIAGNOSIS — G8191 Hemiplegia, unspecified affecting right dominant side: Secondary | ICD-10-CM | POA: Diagnosis present

## 2020-07-03 DIAGNOSIS — R29810 Facial weakness: Secondary | ICD-10-CM | POA: Diagnosis present

## 2020-07-03 DIAGNOSIS — I639 Cerebral infarction, unspecified: Secondary | ICD-10-CM | POA: Diagnosis present

## 2020-07-03 DIAGNOSIS — E1165 Type 2 diabetes mellitus with hyperglycemia: Secondary | ICD-10-CM | POA: Diagnosis present

## 2020-07-03 DIAGNOSIS — E785 Hyperlipidemia, unspecified: Secondary | ICD-10-CM | POA: Diagnosis present

## 2020-07-03 DIAGNOSIS — U071 COVID-19: Secondary | ICD-10-CM | POA: Diagnosis present

## 2020-07-03 DIAGNOSIS — E7800 Pure hypercholesterolemia, unspecified: Secondary | ICD-10-CM | POA: Diagnosis present

## 2020-07-03 DIAGNOSIS — R29703 NIHSS score 3: Secondary | ICD-10-CM | POA: Diagnosis present

## 2020-07-03 DIAGNOSIS — I6381 Other cerebral infarction due to occlusion or stenosis of small artery: Secondary | ICD-10-CM | POA: Diagnosis present

## 2020-07-03 DIAGNOSIS — Z803 Family history of malignant neoplasm of breast: Secondary | ICD-10-CM | POA: Diagnosis not present

## 2020-07-03 DIAGNOSIS — R471 Dysarthria and anarthria: Secondary | ICD-10-CM | POA: Diagnosis present

## 2020-07-03 DIAGNOSIS — I6389 Other cerebral infarction: Secondary | ICD-10-CM | POA: Diagnosis not present

## 2020-07-03 DIAGNOSIS — I452 Bifascicular block: Secondary | ICD-10-CM | POA: Diagnosis present

## 2020-07-03 DIAGNOSIS — I10 Essential (primary) hypertension: Secondary | ICD-10-CM | POA: Diagnosis present

## 2020-07-03 DIAGNOSIS — R4781 Slurred speech: Secondary | ICD-10-CM | POA: Diagnosis present

## 2020-07-03 DIAGNOSIS — E119 Type 2 diabetes mellitus without complications: Secondary | ICD-10-CM | POA: Diagnosis not present

## 2020-07-03 DIAGNOSIS — R739 Hyperglycemia, unspecified: Secondary | ICD-10-CM

## 2020-07-03 DIAGNOSIS — I361 Nonrheumatic tricuspid (valve) insufficiency: Secondary | ICD-10-CM | POA: Diagnosis not present

## 2020-07-03 LAB — CBG MONITORING, ED
Glucose-Capillary: 339 mg/dL — ABNORMAL HIGH (ref 70–99)
Glucose-Capillary: 203 mg/dL — ABNORMAL HIGH (ref 70–99)
Glucose-Capillary: 170 mg/dL — ABNORMAL HIGH (ref 70–99)
Glucose-Capillary: 198 mg/dL — ABNORMAL HIGH (ref 70–99)
Glucose-Capillary: 195 mg/dL — ABNORMAL HIGH (ref 70–99)

## 2020-07-03 LAB — CK: Total CK: 47 U/L (ref 38–234)

## 2020-07-03 LAB — URINALYSIS, ROUTINE W REFLEX MICROSCOPIC
Bilirubin Urine: NEGATIVE
Glucose, UA: >=500 mg/dL — AB
Hgb urine dipstick: NEGATIVE
Ketones, ur: NEGATIVE mg/dL
Nitrite: NEGATIVE
Protein, ur: NEGATIVE mg/dL
Specific Gravity, Urine: 1.03 (ref 1.005–1.030)
pH: 6 (ref 5.0–8.0)

## 2020-07-03 LAB — HEMOGLOBIN A1C
Hgb A1c MFr Bld: 10.2 % — ABNORMAL HIGH (ref 4.8–5.6)
Mean Plasma Glucose: 246.04 mg/dL

## 2020-07-03 LAB — RESP PANEL BY RT-PCR (FLU A&B, COVID) ARPGX2
Influenza A by PCR: NEGATIVE
Influenza B by PCR: NEGATIVE
SARS Coronavirus 2 by RT PCR: POSITIVE — AB

## 2020-07-03 MED ORDER — LORAZEPAM 2 MG/ML IJ SOLN
1.0000 mg | Freq: Once | INTRAMUSCULAR | Status: DC
  Filled 2020-07-03: qty 1

## 2020-07-03 MED ORDER — INSULIN ASPART 100 UNIT/ML ~~LOC~~ SOLN
8.0000 [IU] | Freq: Once | SUBCUTANEOUS | Status: AC
  Administered 2020-07-03: 8 [IU] via SUBCUTANEOUS

## 2020-07-03 MED ORDER — LORAZEPAM 2 MG/ML IJ SOLN
1.0000 mg | Freq: Once | INTRAMUSCULAR | Status: AC
  Administered 2020-07-03: 1 mg via INTRAVENOUS
  Filled 2020-07-03: qty 1

## 2020-07-03 MED ORDER — IBUPROFEN 200 MG PO TABS
600.0000 mg | ORAL_TABLET | Freq: Four times a day (QID) | ORAL | Status: DC | PRN
  Administered 2020-07-03: 600 mg via ORAL
  Filled 2020-07-03: qty 1

## 2020-07-03 MED ORDER — ACETAMINOPHEN 160 MG/5ML PO SOLN: 650.0000 mg | ORAL | Status: DC | PRN

## 2020-07-03 MED ORDER — ATORVASTATIN CALCIUM 40 MG PO TABS
40.0000 mg | ORAL_TABLET | Freq: Every day | ORAL | Status: DC
  Administered 2020-07-03: 40 mg via ORAL
  Filled 2020-07-03 (×2): qty 1

## 2020-07-03 MED ORDER — ACETAMINOPHEN 325 MG PO TABS: 650.0000 mg | ORAL_TABLET | ORAL | Status: DC | PRN

## 2020-07-03 MED ORDER — PANTOPRAZOLE SODIUM 40 MG PO TBEC
40.0000 mg | DELAYED_RELEASE_TABLET | Freq: Once | ORAL | Status: AC
  Administered 2020-07-03: 40 mg via ORAL
  Filled 2020-07-03: qty 1

## 2020-07-03 MED ORDER — CLOPIDOGREL BISULFATE 75 MG PO TABS
75.0000 mg | ORAL_TABLET | Freq: Every day | ORAL | Status: DC
Start: 2020-07-03 — End: 2020-07-03
  Administered 2020-07-03: 75 mg via ORAL
  Filled 2020-07-03: qty 1

## 2020-07-03 MED ORDER — STROKE: EARLY STAGES OF RECOVERY BOOK: Freq: Once | Status: AC

## 2020-07-03 MED ORDER — ACETAMINOPHEN 650 MG RE SUPP: 650.0000 mg | RECTAL | Status: DC | PRN

## 2020-07-03 MED ORDER — ASPIRIN 300 MG RE SUPP: 300.0000 mg | Freq: Once | RECTAL | Status: DC

## 2020-07-03 MED ORDER — ENOXAPARIN SODIUM 40 MG/0.4ML ~~LOC~~ SOLN
40.0000 mg | SUBCUTANEOUS | Status: DC
  Administered 2020-07-03 – 2020-07-05 (×2): 40 mg via SUBCUTANEOUS
  Filled 2020-07-03 (×2): qty 0.4

## 2020-07-03 MED ORDER — ASPIRIN EC 81 MG PO TBEC
81.0000 mg | DELAYED_RELEASE_TABLET | Freq: Every day | ORAL | Status: DC
Start: 2020-07-04 — End: 2020-07-05
  Administered 2020-07-05: 81 mg via ORAL
  Filled 2020-07-03 (×2): qty 1

## 2020-07-03 MED ORDER — ASPIRIN EC 325 MG PO TBEC
325.0000 mg | DELAYED_RELEASE_TABLET | Freq: Once | ORAL | Status: DC
  Administered 2020-07-03: 325 mg via ORAL
  Filled 2020-07-03: qty 1

## 2020-07-03 MED ORDER — LIDOCAINE VISCOUS HCL 2 % MT SOLN
15.0000 mL | Freq: Once | OROMUCOSAL | Status: AC
  Administered 2020-07-03: 15 mL via OROMUCOSAL
  Filled 2020-07-03: qty 15

## 2020-07-03 MED ORDER — INSULIN ASPART 100 UNIT/ML ~~LOC~~ SOLN
0.0000 [IU] | SUBCUTANEOUS | Status: DC
  Administered 2020-07-03: 3 [IU] via SUBCUTANEOUS
  Administered 2020-07-03 (×2): 2 [IU] via SUBCUTANEOUS
  Administered 2020-07-04: 3 [IU] via SUBCUTANEOUS
  Administered 2020-07-04 (×2): 2 [IU] via SUBCUTANEOUS
  Administered 2020-07-04: 1 [IU] via SUBCUTANEOUS
  Administered 2020-07-04 – 2020-07-05 (×2): 3 [IU] via SUBCUTANEOUS
  Administered 2020-07-05: 2 [IU] via SUBCUTANEOUS

## 2020-07-03 MED ORDER — LORAZEPAM 1 MG PO TABS
0.5000 mg | ORAL_TABLET | Freq: Once | ORAL | Status: AC | PRN
  Administered 2020-07-03: 0.5 mg via ORAL

## 2020-07-03 MED ORDER — SODIUM CHLORIDE 0.9 % IV BOLUS
1000.0000 mL | Freq: Once | INTRAVENOUS | Status: AC
  Administered 2020-07-03: 1000 mL via INTRAVENOUS

## 2020-07-03 MED ORDER — GUAIFENESIN 200 MG PO TABS
200.0000 mg | ORAL_TABLET | ORAL | Status: DC | PRN
  Filled 2020-07-03: qty 1

## 2020-07-03 MED ORDER — SENNOSIDES-DOCUSATE SODIUM 8.6-50 MG PO TABS: 1.0000 | ORAL_TABLET | Freq: Every evening | ORAL | Status: DC | PRN

## 2020-07-03 NOTE — Progress Notes (Signed)
Spoke with Diana Elliott's daughter, Diana Elliott, regarding Diana Elliott's current hospitalization and updated her on presenting diagnosis. 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 She mentions that she has previously been on medications for her 'bipolar disorder' in the past but she self-discontinued many years ago due to 'not liking how it feels' and she has not been following regularly with a primary care provider recently.

## 2020-07-03 NOTE — ED Provider Notes (Signed)
[facility] EMERGENCY DEPARTMENT Provider Note   CSN: [id number] Arrival date & time: 07/02/20  1747     History Chief Complaint  Patient presents with  . Aphasia    Diana Elliott is a 75 y.o. female.  History is provided by the patient and her daughter who is at bedside. It sounds like on 07/01/2020 at around 2200 patient noticed that she was having some paresthesias on her right side. She cannot think anything of it and thought was related to the Metamucil. She went to sleep and sounds like throughout the day yesterday she had worsening numbness of her right side, lower extremity weakness worse than normal. Her daughter states that she is on she was slurring her speech as well. Patient states that her mouth is numb causing her to bite her tongue and cheek a lot. No history of the same. There was report of some facial droop however does not have that now. She does have some episodic burning chest pain.        History reviewed. No pertinent past medical history.  Patient Active Problem List   Diagnosis Date Noted  . Atelectasis 11/20/2015  . Diabetes mellitus type 2, diet-controlled (HCC) 11/19/2015  . Chest pain of unknown etiology 11/19/2015    Past Surgical History:  Procedure Laterality Date  . TUBAL LIGATION       OB History   No obstetric history on file.     Family History  Problem Relation Age of Onset  . Colon cancer Neg Hx   . Esophageal cancer Neg Hx   . Rectal cancer Neg Hx   . Stomach cancer Neg Hx     Social History   Tobacco Use  . Smoking status: Never Smoker  . Smokeless tobacco: Never Used  Vaping Use  . Vaping Use: Never used  Substance Use Topics  . Alcohol use: No    Alcohol/week: 0.0 standard drinks  . Drug use: No    Home Medications Prior to Admission medications   Medication Sig Start Date End Date Taking? Authorizing Provider  AMBULATORY NON FORMULARY MEDICATION Medication Name: Nitroglycerin ointment 0.125%  use pea sized amount 4 times a day per rectum  I 07/06/19   Napoleon Form, MD  b complex vitamins tablet Take 1 tablet by mouth daily.    [provider]  BLACK CURRANT SEED OIL PO Take 1 capsule by mouth daily.    [provider]  cephALEXin (KEFLEX) 500 MG capsule Take 1 capsule (500 mg total) by mouth 3 (three) times daily. 06/03/20   Cheryll Cockayne, MD  Cholecalciferol (VITAMIN D PO) Take 1 tablet by mouth daily.    [provider]  Omega-3 Fatty Acids (OMEGA 3 PO) Take 1 capsule by mouth daily.    [provider]  TURMERIC PO Take 1 capsule by mouth daily.    [provider]    Allergies    Patient has no known allergies.  Review of Systems   Review of Systems  All other systems reviewed and are negative.   Physical Exam Updated Vital Signs BP (!) 156/80 (BP Location: Right Arm)   Pulse 72   Temp 98.7 F (37.1 C) (Oral)   Resp 17   SpO2 100%   Physical Exam Vitals and nursing note reviewed.  Constitutional:      Appearance: She is well-developed and well-nourished.  HENT:     Head: Normocephalic and atraumatic.     Nose: No congestion  or rhinorrhea.     Mouth/Throat:     Mouth: Mucous membranes are moist.  Eyes:     Pupils: Pupils are equal, round, and reactive to light.  Cardiovascular:     Rate and Rhythm: Normal rate and regular rhythm.  Pulmonary:     Effort: No respiratory distress.     Breath sounds: No stridor.  Abdominal:     General: There is no distension.  Musculoskeletal:        General: No swelling or tenderness. Normal range of motion.     Cervical back: Normal range of motion.  Skin:    General: Skin is warm and dry.  Neurological:     Mental Status: She is alert.     Comments: Slurred speech, decreased sensation to the right side of her body, decreased strength in the right arm compared to the left.  Lower extremities seem to be equal.     ED Results / Procedures / Treatments   Labs (all  labs ordered are listed, but only abnormal results are displayed) Labs Reviewed  CBC - Abnormal; Notable for the following components:      Result Value   RBC 5.38 (*)    MCV 78.6 (*)    MCH 24.9 (*)    All other components within normal limits  COMPREHENSIVE METABOLIC PANEL - Abnormal; Notable for the following components:   Sodium 133 (*)    Glucose, Bld 325 (*)    Albumin 3.4 (*)    All other components within normal limits  I-STAT CHEM 8, ED - Abnormal; Notable for the following components:   Glucose, Bld 325 (*)    All other components within normal limits  URINE CULTURE  PROTIME-INR  APTT  DIFFERENTIAL  URINALYSIS, ROUTINE W REFLEX MICROSCOPIC  CK  CBG MONITORING, ED    EKG EKG Interpretation  Date/Time:  Monday July 02 2020 17:59:44 EST Ventricular Rate:  88 PR Interval:  174 QRS Duration: 120 QT Interval:  382 QTC Calculation: 462 R Axis:   -63 Text Interpretation: Normal sinus rhythm Right atrial enlargement Pulmonary disease pattern Right bundle branch block Left anterior fascicular block  Bifascicular block  Abnormal ECG No old tracing to compare Confirmed by Merrily Pew [phone]) on 07/03/2020 3:30:52 AM   Radiology CT Head Wo Contrast  Addendum Date: 07/03/2020   ADDENDUM REPORT: 07/03/2020 01:36 ADDENDUM: These results were called by telephone at the time of interpretation on 07/03/2020 at 1:33 am to provider [facility] , who verbally acknowledged these results. Electronically Signed   By: Fidela Salisbury MD   On: 07/03/2020 01:36   Result Date: 07/03/2020 CLINICAL DATA:  Slurred speech, right-sided numbness, facial droop EXAM: CT HEAD WITHOUT CONTRAST TECHNIQUE: Contiguous axial images were obtained from the base of the skull through the vertex without intravenous contrast. COMPARISON:  None. FINDINGS: Brain: There is normal anatomic configuration of the brain. There is a age indeterminate left thalamic infarct. Mild parenchymal volume loss is commensurate with  the patient's age. No acute intracranial hemorrhage. No abnormal intra or extra-axial mass lesion or fluid collection. No abnormal mass effect or midline shift. The ventricular size is normal. The cerebellum is unremarkable. Vascular: No asymmetric hyperdense vasculature at the skull base. Skull: Intact. Sinuses/Orbits: The paranasal sinuses are clear. The orbits are unremarkable. Other: Mastoid air cells and middle ear cavities are clear. 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 This could be further assessed with MRI examination, if indicated. No superimposed acute hemorrhage. Electronically Signed: By: Cassandria Anger  Christa See MD On: 07/03/2020 01:30    Procedures Procedures (including critical care time)  Medications Ordered in ED Medications  sodium chloride flush (NS) 0.9 % injection 3 mL (has no administration in time range)  lidocaine (XYLOCAINE) 2 % viscous mouth solution 15 mL (has no administration in time range)  pantoprazole (PROTONIX) EC tablet 40 mg (has no administration in time range)    ED Course  I have reviewed the triage vital signs and the nursing notes.  Pertinent labs & imaging results that were available during my care of the patient were reviewed by me and considered in my medical decision making (see chart for details).  Clinical Course as of 07/03/20 2344  Tue Jul 03, 2020  0720 MR BRAIN WO CONTRAST [MS]  0722 MR BRAIN WO CONTRAST [MS]    Clinical Course User Index [MS] Eulis Foster, MD   MDM Rules/Calculators/A&P                          Concern for likely subacute stroke.  Well outside the window for TPA.  CT scan concerning for same.  MRI ordered in triage.  At this time will transfer care to oncoming provider to follow-up MRI. Final Clinical Impression(s) / ED Diagnoses Final diagnoses:  None    Rx / DC Orders ED Discharge Orders    None       Letishia Elliott, Corene Cornea, MD 07/03/20 2346

## 2020-07-03 NOTE — ED Notes (Signed)
Received pt from lobby at this time per wheelchair.  

## 2020-07-03 NOTE — ED Notes (Signed)
Patient transported to MRI 

## 2020-07-03 NOTE — ED Provider Notes (Signed)
Upon reviewing triage note, it sounds like patient may have had an acute cva, appears LKN was 2200 so a head CT was ordered. After discussion with radiologist, this had a concerning finding in area of interest. I will order an MRI to further assess. I have not personally evaluated or interviewed the patient.    Diana Elliott, Barbara Cower, MD 07/03/20 [phone]

## 2020-07-03 NOTE — Progress Notes (Signed)
Pt is refusing this MRI exam. Ordering Dr. Rhina Brackett .

## 2020-07-03 NOTE — ED Provider Notes (Addendum)
Pt signed out by Dr. Clayborne Dana pending MRI.  Pt required ativan for the MRI.  MRI:  IMPRESSION: Acute or early subacute infarct in the ventrolateral left thalamus with extension inferiorly to involve the lateral left midbrain. Mild associated edema without mass effect.  Pt's blood sugar is also in the 300s.  She has a hx of DM2 that had been diet controlled.  It looks like it is no longer diet controlled.  Hgb A1c ordered and is 10.2.   8 units insulin ordered SQ.    Pt d/w Dr. Derry Lory (neurology) who will see pt in consult.  Pt has not seen a pcp in a few years, so I spoke with unassigned IMTS for admission.     Jacalyn Lefevre, MD 07/03/20 1043   Pt's Covid test came back positive.  Pt notified.  Calais Svehla was evaluated in Emergency Department on 07/03/2020 for the symptoms described in the history of present illness. She was evaluated in the context of the global COVID-19 pandemic, which necessitated consideration that the patient might be at risk for infection with the SARS-CoV-2 virus that causes COVID-19. Institutional protocols and algorithms that pertain to the evaluation of patients at risk for COVID-19 are in a state of rapid change based on information released by regulatory bodies including the CDC and federal and state organizations. These policies and algorithms were followed during the patient's care in the ED.   Jacalyn Lefevre, MD 07/03/20 1121

## 2020-07-03 NOTE — ED Notes (Signed)
Pts daughter came up to NT very concerned about her mothers condition. NT explained to her that we are continually checking her VS and that she will be back in a room as soon as she can be. Daughter getting upset with NT.

## 2020-07-03 NOTE — ED Notes (Signed)
Pt flat per MD direction. Fluid bolus infusing

## 2020-07-03 NOTE — Consult Note (Addendum)
NEUROLOGY CONSULTATION NOTE   Date of service: July 03, 2020 Patient Name: Diana Elliott MRN:  [id number] DOB:  Sep 29, 1945 Reason for consult: Acute or subacute infarct in left thalamus. _ _ _   _ __   _ __ _ _  __ __   _ __   __ _  History of Present Illness  Diana Elliott is a 75 y.o. female with PMH significant for T2DM who presented with right-sided numbness and weakness. Diana Elliott states she slept at 2200 on 06/30/20 and woke up around 10.30 am in the morning with numbness and weakness of Rt side of the body. This is improved but since it persisted, she presented to the ED. In the ED her BS was noted to be elevated to 339 and HbA1c was high at 10.2. CMP normal, WBC's also wnl. 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  MRI Brain revealed Acute or early subacute infarct in the ventrolateral left thalamus with extension inferiorly to involve the lateral left midbrain. Diana Elliott was also tested COVID positive. Diana Elliott was out of IV-TPA and thrombectomy window. She was started on Aspirin 325mg . Today on evaluation, Diana Elliott states her numbness and weakness has improved since yesterday but still has some residual weakness and numbness in Rt face and Hand. Diana Elliott denies any weakness in lower extremities, states it has improved. On Examination No facial droop or slurring noted, Numbness on Rt side of face and hand is present. B/L weakness present in upper extremities.  NIHSS:2 mRS:0 tPA, Thrombectomy: outside window.   ROS   Constitutional Denies weight loss, fever and chills.  HEENT Denies changes in vision and hearing.   Respiratory  Reports some cough, denies SOB.  CV Denies palpitations and CP  GI Denies abdominal pain, nausea, vomiting and diarrhea.  GU Denies dysuria and urinary frequency.  MSK Denies myalgia and joint pain.   Skin Denies rash and pruritus.   Neurological Reports headache yesterday, denies syncope.  Psychiatric Denies recent changes in mood. Denies anxiety and  depression.   Past History  History reviewed. No pertinent past medical history. Past Surgical History:  Procedure Laterality Date   TUBAL LIGATION     Family History  Problem Relation Age of Onset   Colon cancer Neg Hx    Esophageal cancer Neg Hx    Rectal cancer Neg Hx    Stomach cancer Neg Hx    Social History   Socioeconomic History   Marital status: Widowed    Spouse name: Not on file   Number of children: Not on file   Years of education: Not on file   Highest education level: Not on file  Occupational History   Not on file  Tobacco Use   Smoking status: Never Smoker   Smokeless tobacco: Never Used  Vaping Use   Vaping Use: Never used  Substance and Sexual Activity   Alcohol use: No    Alcohol/week: 0.0 standard drinks   Drug use: No   Sexual activity: Not on file  Other Topics Concern   Not on file  Social History Narrative   Not on file   Social Determinants of Health   Financial Resource Strain: Not on file  Food Insecurity: Not on file  Transportation Needs: Not on file  Physical Activity: Not on file  Stress: Not on file  Social Connections: Not on file   No Known Allergies  Medications  (Not in a hospital admission)    Vitals   Vitals:  07/03/20 0956 07/03/20 1046 07/03/20 1145 07/03/20 1215  BP: (!) 123/98 123/87 123/88 114/88  Pulse: 87 78 77 76  Resp: 20 18 (!) 21 14  Temp:      TempSrc:      SpO2: 98% 98% 99% 100%     There is no height or weight on file to calculate BMI.  Physical Exam   General: Alert, oriented to place, person and date, month and year. Laying comfortably in bed; Not in acute distress. Moving all limbs.  HENT: Normal oropharynx and mucosa. Normal external appearance of ears and nose. Neck: Supple, no pain or tenderness CV: No JVD. No peripheral edema.  Pulmonary: Symmetric Chest rise. Normal respiratory effort. Abdomen: Soft to touch, non-tender.  Ext: No cyanosis, edema, or deformity  Skin: No rash.  Normal palpation of skin.  Musculoskeletal: Normal digits and nails by inspection. No clubbing.  Neurologic Examination  Mental status/Cognition: Alert, oriented to self, place, date, month and year, good attention. Able to do simple calculations. Speech/language: Fluent, comprehension intact, object naming intact.  Cranial nerves:   CN II Pupils equal and reactive to light, no VF deficits   CN III,IV,VI EOM intact, no gaze preference or deviation, no nystagmus   CN V Decresaed sensation in V1, V2, and V3 segments on Rt side.   CN VII no asymmetry, no nasolabial fold flattening, No facial droop.   CN VIII normal hearing to speech    CN IX & X normal palatal elevation, no uvular deviation   CN XI 5/5 head turn and 5/5 shoulder shrug bilaterally   CN XII midline tongue protrusion   Motor:  Muscle bulk: normal, tone- normal, No tremors.  Motor:  Muscle bulk: poor, tone increased, pronator drift none tremor yes, BL upper extremity action tremor. Mvmt Root Nerve  Muscle Right Left Comments  SA C5/6 Ax Deltoid 5 5    EF C5/6 Mc Biceps 5 5    EE C6/7/8 Rad Triceps 5 5    WF C6/7 Med FCR 5 5    WE C7/8 PIN ECU 5 5    F Ab C8/T1 U ADM/FDI 4+ 5    HF L1/2/3 Fem Illopsoas 5 5    KE L2/3/4 Fem Quad 5 5    DF L4/5 D Peron Tib Ant 5 5    PF S1/2 Tibial Grc/Sol 5 5      Reflexes: 2+ in all muscle groups.   Sensation: Sensation to Light touch is decreased on Rt UE as compared to Left UE. Sensations equal in B/l Lower Extremities.   Coordination/Complex Motor: No Ataxia - Finger to Nose -Slight incoordination on Rt side.  - Heel to shin - No incoordination. - Rapid alternating movement- No incoordination. - Gait: Not able to assess.  NIHSS components Score: Comment  1a Level of Conscious 0[x]  1[]  2[]  3[]      1b LOC Questions 0[x]  1[]  2[]       1c LOC Commands 0[x]  1[]  2[]       2 Best Gaze 0[x]  1[]  2[]       3 Visual 0[x]  1[]  2[]  3[]      4 Facial Palsy 0[x]  1[]  2[]  3[]      5a Motor  Arm - left 0[x]  1[]  2[]  3[]  4[]  UN[]    5b Motor Arm - Right 0[]  1[x]  2[]  3[]  4[]  UN[]    6a Motor Leg - Left 0[x]  1[]  2[]  3[]  4[]  UN[]    6b Motor Leg - Right 0[x]  1[]  2[]  3[]  4[]  UN[]   7 Limb Ataxia 0[x]  1[]  2[]  3[]  UN[]     8 Sensory 0[]  1[x]  2[]  UN[]      9 Best Language 0[x]  1[]  2[]  3[]      10 Dysarthria 0[x]  1[]  2[]  UN[]      11 Extinct. and Inattention 0[x]  1[]  2[]       TOTAL: 2     Labs   CBC:  Recent Labs  Lab 07/02/20 1824 07/02/20 1939  WBC 4.6  --   NEUTROABS 2.8  --   HGB 13.4 14.6  HCT 42.3 43.0  MCV 78.6*  --   PLT 289  --     Basic Metabolic Panel:  Lab Results  Component Value Date   NA 135 07/02/2020   K 4.0 07/02/2020   CO2 23 07/02/2020   GLUCOSE 325 (H) 07/02/2020   BUN 13 07/02/2020   CREATININE 0.90 07/02/2020   CALCIUM 9.4 07/02/2020   GFRNONAA >60 07/02/2020   GFRAA >60 10/16/2015   Lipid Panel: No results found for: LDLCALC HgbA1c:  Lab Results  Component Value Date   HGBA1C 10.2 (H) 07/03/2020   Urine Drug Screen: No results found for: LABOPIA, COCAINSCRNUR, LABBENZ, AMPHETMU, THCU, LABBARB  Alcohol Level No results found for: [facility]  CT Head without contrast: 07/03/20 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 This could be further assessed with MRI examination, if indicated. No superimposed acute hemorrhage.  MRI Brain 07/03/20- Acute or early subacute infarct in the ventrolateral left thalamus with extension inferiorly to involve the lateral left midbrain. Mild associated edema without mass effect.     Impression   Diana Elliott is a 75 y.o. female with PMH significant for T2DM p/w L thalamic infarct. Her neurologic examination is notable for Numbness and mild weakness in RUE. Diana Elliott was out of IV-TPA and thrombectomy window. Diana Elliott was started on Asprin and statins.     Recommendations  Plan:  - Frequent Neuro checks per stroke unit protocol - Recommend Vascular imaging with MRA Angio Head without contrast and US Carotid doppler -  Recommend obtaining TTE - Control and Monitor BS. - Recommend obtaining Lipid panel with LDL - Continue Lipitor 40 mg Daily. - Antithrombotic - Aspirin 325 mg PO. - Recommend DVT ppx - SBP goal - permissive hypertension first 24 h < 220/110. Hold home meds.  - Recommend Telemetry monitoring for arrythmia - Recommend bedside swallow screen prior to PO intake. - Stroke education booklet - Recommend Diana Elliott/OT/SLP consult ______________________________________________________________________   Thank you for the opportunity to take part in the care of this patient. If you have any further questions, please contact the neurology consultation attending.  Signed,  Dr. Armando Reichert MD PGY1 [facility] _ _ _   _ __   _ __ _ _  __ __   _ __   __ _

## 2020-07-03 NOTE — Hospital Course (Addendum)
Admitted 07/02/2020  Allergies: Patient has no known allergies. Pertinent Hx: ***DM2 (was manageing by diet)  75 y.o. 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 Stroke work up. Neuro rec pending  *COVID-19 infection: Had headache today but no resp or GI symptoms. CXR pending. Vaccinated. On RA.   *Bifascicular block: Unclear chronicity. No known cardiovascular disease.No Hx of syncope per patient. (as much as recalls). Consider consulting cards if symptomatic.   *Hyperglycemia: Hx of diet controlled DM per patient. Can be 2/2 COVID. Checking HbA1c and placing on SSI.  Consults: None  Meds: ASA, plavix, lipitor, SSI VTE ppx: Lovenox IVF: none  Diet: npo until passes swallow eval   * *  []  F/u neuro rec []  f/u Caroti doppler []  diet when passes swallow eval []  Avoid AV nodal blockers  O/N events:

## 2020-07-03 NOTE — Progress Notes (Signed)
Patient was too claustrophobic to proceed with MRI. As soon as I put her in the scanner she began to have difficulty breathing and seemed to panic. She stated that she needed to come out and sit up. When I brought her out she said that she didn't want to continue. I offered to call her nurse to see if we could get her some medication but then realized that she did not have an IV yet. I spoke with her nurse and sent her back to ED waiting room.

## 2020-07-03 NOTE — H&P (Addendum)
Date: 07/03/2020               Patient Name:  Diana Elliott MRN: [id number]  DOB: [birth year] Age / Sex: 75 y.o., female   PCP: Patient, No Pcp Per         Medical Service: Internal Medicine Teaching Service         Attending Physician: Dr. Evette Doffing, Mallie Mussel, *    First Contact: Dr. Carroll Sage Pager: [phone]  Second Contact: Dr. Laural Golden Pager: [phone]       After Hours (After 5p/  First Contact Pager: [phone]  weekends / holidays): Second Contact Pager: [phone]   Chief Complaint: speech difficulty  History of Present Illness: 75 year old female with DM2 (diet-controlled), presented to ED due to worsening of speech difficulty and right facial numbness.  Her symptoms started 2 days ago and progressed.  She mentions that she had some limb weakness (can not specify which side) and numbness of right side of her face. Patient reports that this morning she also developed severe headache which was mostly on top of her head that got better after she took over-the-counter pain medications. She denies any vision change, dizziness or palpitation. She denies any chest pain, shortness of breath.  Also reports bilateral leg pain at rest and with activity that apparently has been chronic. No fever or chills.  She is vaccinated against COVID-19 denies any sick contact.  Meds:  Current Meds  Medication Sig  . b complex vitamins tablet Take 1 tablet by mouth daily.  Marland Kitchen BLACK CURRANT SEED OIL PO Take 1 capsule by mouth daily.  . Cholecalciferol (VITAMIN D PO) Take 1 tablet by mouth daily.  . Omega-3 Fatty Acids (OMEGA 3 PO) Take 1 capsule by mouth daily.  . TURMERIC PO Take 1 capsule by mouth daily.    Allergies: Allergies as of 07/02/2020  . (No Known Allergies)   History reviewed. No pertinent past medical history.  Family History:  Reports multiple family members with breast cancer   Social History:  She reports that she used to live in [place] and moved to [place] few years  ago to live with her daughter. Ms. Greenwalt, denies history of smoking, alcohol or drug use.   Review of Systems: Constitutional: Negative for chills and fever.  Respiratory: Negative for shortness of breath.   Cardiovascular: Negative for chest pain and leg swelling.  Gastrointestinal: Negative for abdominal pain, nausea and vomiting.  Positive for occasional blood in her stool Neurological: Positive for rt facial numbness, speech difficulty positive for headaches.   Physical Exam: Blood pressure 96/73, pulse 62, temperature 98 F (36.7 C), temperature source Oral, resp. rate 19, SpO2 97 %. Constitutional: Well-developed and well-nourished. No acute distress.  HENT:  Head: Normocephalic and atraumatic.  Eyes: Conjunctivae are normal, EOM nl Cardiovascular: RRR, nl S1S2, no murmur, no LEE Respiratory: Effort normal and breath sounds normal. No respiratory distress. No wheezes.  GI: Soft. Bowel sounds are normal. No distension. There is no tenderness.  Neurological: Somnolent, oriented x 3. Has dysarthria. No facial asymmetry. Sensation is intact. Upper extremity motor: Rt hand 4/5, left hand 5/5. Proximal upper extremities: .5/5  Lower extremity motor: 4+/5 (Rt leg slightly weaker than left.) Skin: Not diaphoretic. No erythema.  Psychiatric: Normal mood and affect. Behavior is normal.  EKG: personally reviewed my interpretation is bifascicular block  CXR: Pending  Assessment & Plan by Problem: Active Problems:   Stroke [facility])   COVID-19 virus infection  75 year old  female with past medical history of type 2 diabetes, presented with 2 days history of right facial paresthesia, speech difficulty and headache that started today. Head CT and brain MRI showed acute or subacute left thalamic stroke.  Her lab results are significant for hyperglycemia at 305 (without acidosis) and positive COVID-19 and IMTS was consulted for admission.    Acute or early subacute left thalamic  infarct:  Patient reports that her symptoms are started 2 nights ago with some weakness (unsure which extremities), right facial numbness and speech difficulty.  Her symptoms got worse and also associated with some headache this morning and she came to ED for further evaluation.  On arrival she has, dysarthria, slight right side weakness mostly at distal of right upper extremities. (Patient was somnolent on evaluation and exam was limited as she did not have 100% cooperation)  -CT head showed Age-indeterminate left thalamic infarct, without evidence of hemorrhage. -Brain MRI showed Acute or early subacute infarct in the ventrolateral left thalamus with extension inferiorly to involve the lateral left midbrain and associated edema with no mass effect.  07/01/2020 at night and worsened and developed more symptoms then. Patient is out of thrombolytic window. She had some headache today that improved with OTC medications. Ct head did not suggestive of hemorrhage though.  Risk factors: DM2 No known Hx of HTN. Her BP after arrival was moderately elevated at 140s-170s then became normotensive at120s/80s.  -ASA and Plavix (One dose plavix. penidng neuro rec) -Lipitor 40 mg QD -Completing stroke work up: TTE, lipid panel, HbA1c. Pending neurology rec regarding anti platelet and MRA. -Carotid doppler -Neuro rec pending. Appreciate recommendation -PT/OT eval and treat -Swallow eval -NPO until passes swallow eval -Cardiac monitoring -Frequent neuro check. Call MD if any changes -Out of permissive HTN window (Unless develops new symptoms)  COVID-19 infection:   Patient found to be COVID positive on admission. No respiratory or GI symptoms.  She had headache today but denies body ache or congestion. She states that she is vaccinated against COVID-19 and denies any sick contact. (Per patient, she lives with her daughter who is healthy and vaccinated) CXR pending.  She is saturating well on RA.   -CXR  pending -O2 therapy as needed -Guaifenesin q 4h PRN -CRP,  Ferritin, D-Dimer, LDH, Mg and phos, daily -CBC daily -VTE ppx w Lovenox 40 mg subcutaneous QD  Hyperglycemia:  Hx of DM2 (Managing w diet) BG 325 on arrival. No keto acidosis. Not concerning for HHS w not significantly elevated glucose and nl calculated serum osm. Her hyperglycemia can be 2/2 COVID infection or untreated DM2. HbA1c pending.    -HbA1c -Placing on SSI and monitor CBG  Bifascicular block: Unclear if it is a new finding or chronic. Ne EKG in chart for comparison. No known cardiovascular disease. No Hx of syncope per patient. (as much as she recalls and reports).   -No intervention at this point. Consider cards referral/consult if rise any concern such as prior Hx of syncope or presyncope,..  -Cardiac monitoring -Try to Avoid AV node blockers  Diet: Npo until passes swallow eval IV fluid: None VTE ppx: Lovenox Code status: Full  Dispo: Admit patient to Inpatient with expected length of stay greater than 2 midnights.  SignedChevis Pretty, MD 07/03/2020, 3:29 PM  Pager: [phone] After 5pm on weekdays and 1pm on weekends: On Call pager: [phone]

## 2020-07-03 NOTE — ED Notes (Signed)
Pt flat per MD direction

## 2020-07-03 NOTE — ED Notes (Signed)
First contact. Change of shift. Pt resting in bed. NADN. Neuro improving. A/o x 3

## 2020-07-03 NOTE — ED Notes (Signed)
MD notified on increase in [facility]

## 2020-07-04 ENCOUNTER — Encounter (HOSPITAL_COMMUNITY): Payer: Medicare Other

## 2020-07-04 ENCOUNTER — Inpatient Hospital Stay (HOSPITAL_COMMUNITY): Payer: Medicare Other

## 2020-07-04 DIAGNOSIS — E119 Type 2 diabetes mellitus without complications: Secondary | ICD-10-CM | POA: Diagnosis not present

## 2020-07-04 DIAGNOSIS — I639 Cerebral infarction, unspecified: Secondary | ICD-10-CM | POA: Diagnosis not present

## 2020-07-04 DIAGNOSIS — I63332 Cerebral infarction due to thrombosis of left posterior cerebral artery: Secondary | ICD-10-CM

## 2020-07-04 DIAGNOSIS — E785 Hyperlipidemia, unspecified: Secondary | ICD-10-CM | POA: Diagnosis present

## 2020-07-04 DIAGNOSIS — U071 COVID-19: Secondary | ICD-10-CM | POA: Diagnosis not present

## 2020-07-04 LAB — CBC WITH DIFFERENTIAL/PLATELET
Abs Immature Granulocytes: 0.02 10*3/uL (ref 0.00–0.07)
Basophils Absolute: 0 10*3/uL (ref 0.0–0.1)
Basophils Relative: 0 %
Eosinophils Absolute: 0.1 10*3/uL (ref 0.0–0.5)
Eosinophils Relative: 1 %
HCT: 41.4 % (ref 36.0–46.0)
Hemoglobin: 13.1 g/dL (ref 12.0–15.0)
Immature Granulocytes: 1 %
Lymphocytes Relative: 27 %
Lymphs Abs: 1.2 10*3/uL (ref 0.7–4.0)
MCH: 25 pg — ABNORMAL LOW (ref 26.0–34.0)
MCHC: 31.6 g/dL (ref 30.0–36.0)
MCV: 78.9 fL — ABNORMAL LOW (ref 80.0–100.0)
Monocytes Absolute: 0.4 10*3/uL (ref 0.1–1.0)
Monocytes Relative: 9 %
Neutro Abs: 2.7 10*3/uL (ref 1.7–7.7)
Neutrophils Relative %: 62 %
Platelets: 297 10*3/uL (ref 150–400)
RBC: 5.25 MIL/uL — ABNORMAL HIGH (ref 3.87–5.11)
RDW: 14 % (ref 11.5–15.5)
WBC: 4.4 10*3/uL (ref 4.0–10.5)
nRBC: 0 % (ref 0.0–0.2)

## 2020-07-04 LAB — COMPREHENSIVE METABOLIC PANEL
ALT: 14 U/L (ref 0–44)
AST: 15 U/L (ref 15–41)
Albumin: 3 g/dL — ABNORMAL LOW (ref 3.5–5.0)
Alkaline Phosphatase: 100 U/L (ref 38–126)
Anion gap: 9 (ref 5–15)
BUN: 12 mg/dL (ref 8–23)
CO2: 24 mmol/L (ref 22–32)
Calcium: 8.7 mg/dL — ABNORMAL LOW (ref 8.9–10.3)
Chloride: 104 mmol/L (ref 98–111)
Creatinine, Ser: 0.78 mg/dL (ref 0.44–1.00)
GFR, Estimated: >60 mL/min (ref >60)
Glucose, Bld: 199 mg/dL — ABNORMAL HIGH (ref 70–99)
Potassium: 4 mmol/L (ref 3.5–5.1)
Sodium: 137 mmol/L (ref 135–145)
Total Bilirubin: 1.3 mg/dL — ABNORMAL HIGH (ref 0.3–1.2)
Total Protein: 6.3 g/dL — ABNORMAL LOW (ref 6.5–8.1)

## 2020-07-04 LAB — CBG MONITORING, ED
Glucose-Capillary: 135 mg/dL — ABNORMAL HIGH (ref 70–99)
Glucose-Capillary: 166 mg/dL — ABNORMAL HIGH (ref 70–99)
Glucose-Capillary: 178 mg/dL — ABNORMAL HIGH (ref 70–99)

## 2020-07-04 LAB — MAGNESIUM: Magnesium: 1.8 mg/dL (ref 1.7–2.4)

## 2020-07-04 LAB — GLUCOSE, CAPILLARY
Glucose-Capillary: 244 mg/dL — ABNORMAL HIGH (ref 70–99)
Glucose-Capillary: 240 mg/dL — ABNORMAL HIGH (ref 70–99)

## 2020-07-04 LAB — D-DIMER, QUANTITATIVE: D-Dimer, Quant: 0.76 ug/mL-FEU — ABNORMAL HIGH (ref 0.00–0.50)

## 2020-07-04 LAB — URINE CULTURE: Culture: >=100000 — AB

## 2020-07-04 LAB — LIPID PANEL
Cholesterol: 294 mg/dL — ABNORMAL HIGH (ref 0–200)
HDL: 33 mg/dL — ABNORMAL LOW (ref >40)
LDL Cholesterol: 228 mg/dL — ABNORMAL HIGH (ref 0–99)
Total CHOL/HDL Ratio: 8.9 RATIO
Triglycerides: 164 mg/dL — ABNORMAL HIGH (ref <150)
VLDL: 33 mg/dL (ref 0–40)

## 2020-07-04 LAB — PHOSPHORUS: Phosphorus: 3 mg/dL (ref 2.5–4.6)

## 2020-07-04 LAB — FERRITIN: Ferritin: 265 ng/mL (ref 11–307)

## 2020-07-04 LAB — C-REACTIVE PROTEIN: CRP: 1.3 mg/dL — ABNORMAL HIGH (ref <1.0)

## 2020-07-04 MED ORDER — CLOPIDOGREL BISULFATE 75 MG PO TABS: 300.0000 mg | ORAL_TABLET | Freq: Once | ORAL | Status: DC

## 2020-07-04 MED ORDER — IOHEXOL 350 MG/ML SOLN
75.0000 mL | Freq: Once | INTRAVENOUS | Status: AC | PRN
  Administered 2020-07-04: 75 mL via INTRAVENOUS

## 2020-07-04 MED ORDER — MAGNESIUM SULFATE 2 GM/50ML IV SOLN
2.0000 g | Freq: Once | INTRAVENOUS | Status: AC
  Administered 2020-07-04: 2 g via INTRAVENOUS
  Filled 2020-07-04: qty 50

## 2020-07-04 MED ORDER — CLOPIDOGREL BISULFATE 75 MG PO TABS
75.0000 mg | ORAL_TABLET | Freq: Every day | ORAL | Status: DC
Start: 2020-07-05 — End: 2020-07-05
  Administered 2020-07-05: 75 mg via ORAL
  Filled 2020-07-04: qty 1

## 2020-07-04 NOTE — Progress Notes (Addendum)
Subjective:  Diana Elliott is a 75 y.o. with PMH of DM, bipolar disorder admit for R sided weakness due to acute CVA on hospital day 1  Diana Elliott was examined and evaluated at bedside this am. She mentions improvement in her neurological symptoms overnight. Mentions that she had some difficulty with MRA yesterday due to subjective dyspnea while wearing her mask. She was observed to repeatedly pull off her mask during evaluation.  Objective:  Vital signs in last 24 hours: Vitals:   07/04/20 0035 07/04/20 0045 07/04/20 0345 07/04/20 0645  BP: (!) 149/92 134/83 137/81 (!) 147/81  Pulse: 88 84 84 80  Resp: (!) 22 16 14 16   Temp:    98.7 F (37.1 C)  TempSrc:    Oral  SpO2: 100% 99% 100% 100%   Gen: Well-developed, well nourished, NAD HEENT: NCAT head, hearing intact, EOMI, MMM Pulm: Breathing comfortably on room air, no cough, no distress  Extm: ROM intact, No peripheral edema Skin: Dry, Warm, normal turgor Neurologic exam: Mental status: A&Ox3 Cranial Nerves:             II: PERRL             III, IV, VI: Extra-occular motions intact bilaterally             V, VII: Face symmetric, sensation intact in all 3 divisions               VIII: hearing normal to rubbing fingers bilaterally               IX, X: palate rises symmetrically             XI: Head turn and shoulder shrug normal bilaterally               XII: tongue midline    Motor: Strength 4/5 RUE, Strength 5/5 LUE, RLE, LLE, bulk muscle and tone are normal  Sensory: Light touch intact and symmetric bilaterally  Coordination: There is no dysmetria on finger-to-nose. Rapid alternating movement test normal. Psychiatric: Normal mood and affect  Assessment/Plan:  Principal Problem:   Cerebral infarction [facility]) Active Problems:   Diabetes mellitus type 2, diet-controlled (HCC)   COVID-19 virus infection   Hyperlipidemia  Diana Elliott is a 75 yo F w/ PMH of T2DM presenting to [facility] with right facial paresthesia,  dysarthria, headache admit for acute L thalamic infarct  R sided weakness / paresthesia 2/2 Acute L thalamic infarct Present with new neurological symptoms of 2 day duration that worsened on day of admission. No prior history. Does not follow a PCP. Risk factors include active COVID infection and uncontrolled diabetes. No known history of arrhythmia, tobacco use, hyperlipidemia. Symptomatic improving. Needing continued admission for ongoing work-up - Appreciate neuro recs - F/u CTA head/neck - F/u echocardiogram, lipid panel - PT/OT/Speech eval - Telemetry - C/w aspirin, atorvastatin, clopidogrel  Uncontrolled T2DM On admission hgb a1c of 10.2. Mentions not taking any diabetic medications. Am cbg 135. 19 unit insulin requirement last 24 hours. Will need to establish care with PCP with close follow up for management. - Start Lantus 10 units qhs - Novolog 3 units TID qc - SSI - glucose checks  Positive COVID status On admission noted to have positive COVID status. Have been vaccinated. Inflammatory markers mildly elevated with D-dimer 0.76, CRP 1.3. O2 sat 100 on room air. Afebrile. - No indication for treatment for COVID at this time - Airborn/contact precautions for 10 days (+ test 07/03/20) - Monitor respiratory  status  Bifascicular Block EKG showing bifascicular block, with no prior EKG to compare. Vitals stable. HR 80. Asymptomatic - Telemetry  DVT prophx: lovenox Diet: Carb modified Bowel: N/A Code: Full  Prior to Admission Living Arrangement: Home Anticipated Discharge Location: Home Barriers to Discharge: Medical work-up Dispo: Anticipated discharge in approximately 1-2 day(s).   Diana Anis, MD 07/04/2020, 6:56 AM Pager: [phone] After 5pm on weekdays and 1pm on weekends: On Call Pager: [phone]

## 2020-07-04 NOTE — Progress Notes (Signed)
PT Cancellation Note  Patient Details Name: Diana Elliott MRN: [id number] DOB: [birth year]   Cancelled Treatment:    Reason Eval/Treat Not Completed: Patient at procedure or test/unavailable  Attempted to see pt in ED, however pt in process of being transferred to 3W.   Jerolyn Center, PT Pager [phone]  Zena Amos 07/04/2020, 4:53 PM

## 2020-07-04 NOTE — Progress Notes (Addendum)
STROKE TEAM PROGRESS NOTE   Subjective  She is awake and resting comfortably in bed. She describes her stroke symptoms and that her daughter had a possible Covid exposure and was sick with symptoms prior to admission but did not get tested. She was educated about stroke, stroke risk factors and her stroke treatment. She presented with right-sided numbness which is improving but still present. MRI scan confirms a small left thalamic infarct. CT angiogram shows no significant large vessel stenosis or occlusion. Objective   Lipid Panel: No results for input(s): CHOL, TRIG, HDL, CHOLHDL, VLDL, LDLCALC in the last 168 hours.   HgbA1c:  Recent Labs  Lab 07/03/20 1007  HGBA1C 10.2*   Pertinent Imaging   07/03/20 CT Head 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 This could be further assessed with MRI examination, if indicated. No superimposed acute hemorrhage.  07/03/20 MRI Brain WO Contrast Acute or early subacute infarct in the ventrolateral left thalamus with extension inferiorly to involve the lateral left midbrain. Mild associated edema without mass effect.  07/04/20 CT Angio Head and Neck  CTA NECK FINDINGS Aortic arch: Aortic atherosclerotic calcification. Branching pattern is normal without brachiocephalic vessel origin stenosis.  Right carotid system: Common carotid artery is tortuous but widely patent to bifurcation. Carotid bifurcation is widely patent. No soft or calcified plaque. Cervical ICA is normal.  Left carotid system: Common carotid artery widely patent to the bifurcation. The vessel is tortuous. No carotid bifurcation stenosis or irregularity. Cervical ICA is tortuous but widely patent.  Vertebral arteries: Both vertebral artery origins are widely patent. Both vertebral arteries appear normal through the cervical region to the foramen magnum.  Skeleton: Ordinary cervical spondylosis.  Other neck: No mass or lymphadenopathy.  Upper chest: Negative  CTA  HEAD FINDINGS  Anterior circulation: Both internal carotid arteries widely patent through the skull base and siphon regions. The anterior and middle cerebral vessels are patent without proximal stenosis or branch vessel occlusion. No aneurysm or vascular malformation.  Posterior circulation: Both vertebral arteries widely patent through the foramen magnum. Dominant right PICA. Both vertebral arteries supply the basilar. No basilar stenosis. Superior cerebellar and posterior cerebral arteries are patent, but there is stenotic disease within the left PCA of moderate severity.  Venous sinuses: Patent and normal.  Anatomic variants: None significant.  07/04/20 Echocardiogram  Results pending    PHYSICAL EXAM Constitutional: NAD  MS: AAOx4, follows commands Speech: Fluent, no dysarthria  CN: EOMI, VFF, Subtle right facial weakness, Tongue midline, shoulder shrug intact  Motor: No drift in any extremities and she is antigravity throughout. Satellite sign with orbiting around the right forearm. Decreased finger tapping on the right. Orbits left over right upper extremity. Mild weakness of right hip flexors and ankle dorsiflexors on double simultaneous testing Sensation: Decreased sensation to light tough to the right face, right arm and right leg  Coordination: Intact FNF  Gait: Deferred   NIHSS 1 for sensation   ASSESSMENT/PLAN [name] is a 75 y.o. female w/pmh of DMII who presents with right sided numbness and weakness, found to have a left thalamic stroke w/extension into the midbrain. She was outside the time window to receive IVTPA and thrombectomy. She has also been found to be Covid positive though she does not have any active symptoms  #Left Thalamic Stroke w/extension into the Lateral Left Midbrain  Patient presents with right sided numbness and weakness that improved but due to it persisting she presented to the ED. CTH ruled out hemorrhage but revealed  age-indeterminate  left thalamic infarct. MRI Brain w/acute or early subacute infarct in the ventrolateral left thalamus with extension inferiorly to involve the lateral left midbrain. CTA Head and Neck w/ no significant carotid bifurcation disease, moderate stenotic disease of the left PCA and aortic atherosclerosis. Stroke labs w/A1C 10.2. Her stroke work up is not complete at this time and is pending Echocardiogram and Lipid panel. For secondary stroke prevention she was started on DAPT per the POINT trial. She was also started on Atorvastatin 40 mg for secondary stroke prevention. Stroke etiology is likely in the setting of small vessel disease given uncontrolled diabetes.  - DAPT for 21 days followed by ASA monotherapy for secondary stroke prevention. Started 07/04/20 end 07/25/20 - Continue Atorvastatin 40 mg for secondary stroke prevention  - PT/OT/ST evaluations  - Follow up with neurology at discharge   #Hypertension Recommend permissive HTN for the first 24-48 hours and then gradually normalizing blood pressure. Per review of her home medications she is not on any antihypertensives. SBP so far this admission has fluctuated trending in the 110-160 range.  #Uncontrolled Type II Diabetes  A1C this admission 10.2. Goal from a stroke prevention stand point is < 7.0. Recommend SSI for blood sugar control while hospitalized and at discharge to start her on Metformin + follow up with PCP for diabetes management.   #[id number] Management per primary team. Covid PCR positive this admission.   #Other Stroke Risk Factors  Advanced Age >/= 18   DMII    Hospital day # 1  Stark Jock, NP  [facility] Nurse Practitioner Patient seen and discussed with attending physician Dr. Pearlean Brownie   Stroke Attending Note ; I have personally obtained history,examined this patient, reviewed notes, independently viewed imaging studies, participated in medical decision making and plan of care.ROS completed  by me personally and pertinent positives fully documented  I have made any additions or clarifications directly to the above note. Agree with note above. She presented with right-sided numbness secondary to left thalamic lacunar infarct from small vessel disease. Recommend aspirin Plavix for 3 weeks followed by aspirin alone and aggressive risk factor modification. Statin for elevated lipids. Check echocardiogram. Continue Covid treatment and isolation precautions as per primary team. Greater than 50% time during this 35-minute visit was spent with counseling and coordination of care about her lacunar stroke and answering questions.  Delia Heady, MD Medical Director [facility] Pager: [phone] 07/04/2020 4:47 PM  To contact Stroke Continuity provider, please refer to [email or web address]. After hours, contact General Neurology

## 2020-07-04 NOTE — ED Notes (Signed)
Call the daughter Gareth Morgan at [phone] with an update

## 2020-07-05 ENCOUNTER — Telehealth: Payer: Self-pay

## 2020-07-05 ENCOUNTER — Inpatient Hospital Stay (HOSPITAL_COMMUNITY): Payer: Medicare Other

## 2020-07-05 ENCOUNTER — Other Ambulatory Visit (HOSPITAL_COMMUNITY): Payer: Self-pay | Admitting: Internal Medicine

## 2020-07-05 DIAGNOSIS — I361 Nonrheumatic tricuspid (valve) insufficiency: Secondary | ICD-10-CM | POA: Diagnosis not present

## 2020-07-05 DIAGNOSIS — U071 COVID-19: Secondary | ICD-10-CM | POA: Diagnosis not present

## 2020-07-05 DIAGNOSIS — I63332 Cerebral infarction due to thrombosis of left posterior cerebral artery: Secondary | ICD-10-CM | POA: Diagnosis not present

## 2020-07-05 DIAGNOSIS — E119 Type 2 diabetes mellitus without complications: Secondary | ICD-10-CM | POA: Diagnosis not present

## 2020-07-05 DIAGNOSIS — I6389 Other cerebral infarction: Secondary | ICD-10-CM

## 2020-07-05 LAB — C-REACTIVE PROTEIN: CRP: 0.8 mg/dL (ref <1.0)

## 2020-07-05 LAB — CBC WITH DIFFERENTIAL/PLATELET
Abs Immature Granulocytes: 0.02 10*3/uL (ref 0.00–0.07)
Basophils Absolute: 0 10*3/uL (ref 0.0–0.1)
Basophils Relative: 0 %
Eosinophils Absolute: 0.2 10*3/uL (ref 0.0–0.5)
Eosinophils Relative: 3 %
HCT: 38.7 % (ref 36.0–46.0)
Hemoglobin: 12.6 g/dL (ref 12.0–15.0)
Immature Granulocytes: 0 %
Lymphocytes Relative: 21 %
Lymphs Abs: 1.2 10*3/uL (ref 0.7–4.0)
MCH: 25.3 pg — ABNORMAL LOW (ref 26.0–34.0)
MCHC: 32.6 g/dL (ref 30.0–36.0)
MCV: 77.7 fL — ABNORMAL LOW (ref 80.0–100.0)
Monocytes Absolute: 0.4 10*3/uL (ref 0.1–1.0)
Monocytes Relative: 7 %
Neutro Abs: 3.8 10*3/uL (ref 1.7–7.7)
Neutrophils Relative %: 69 %
Platelets: 321 10*3/uL (ref 150–400)
RBC: 4.98 MIL/uL (ref 3.87–5.11)
RDW: 13.9 % (ref 11.5–15.5)
WBC: 5.6 10*3/uL (ref 4.0–10.5)
nRBC: 0 % (ref 0.0–0.2)

## 2020-07-05 LAB — COMPREHENSIVE METABOLIC PANEL
ALT: 15 U/L (ref 0–44)
AST: 14 U/L — ABNORMAL LOW (ref 15–41)
Albumin: 3 g/dL — ABNORMAL LOW (ref 3.5–5.0)
Alkaline Phosphatase: 97 U/L (ref 38–126)
Anion gap: 9 (ref 5–15)
BUN: 11 mg/dL (ref 8–23)
CO2: 23 mmol/L (ref 22–32)
Calcium: 8.5 mg/dL — ABNORMAL LOW (ref 8.9–10.3)
Chloride: 105 mmol/L (ref 98–111)
Creatinine, Ser: 0.75 mg/dL (ref 0.44–1.00)
GFR, Estimated: >60 mL/min (ref >60)
Glucose, Bld: 164 mg/dL — ABNORMAL HIGH (ref 70–99)
Potassium: 3.8 mmol/L (ref 3.5–5.1)
Sodium: 137 mmol/L (ref 135–145)
Total Bilirubin: 1.1 mg/dL (ref 0.3–1.2)
Total Protein: 6.4 g/dL — ABNORMAL LOW (ref 6.5–8.1)

## 2020-07-05 LAB — GLUCOSE, CAPILLARY
Glucose-Capillary: 148 mg/dL — ABNORMAL HIGH (ref 70–99)
Glucose-Capillary: 156 mg/dL — ABNORMAL HIGH (ref 70–99)
Glucose-Capillary: 166 mg/dL — ABNORMAL HIGH (ref 70–99)
Glucose-Capillary: 200 mg/dL — ABNORMAL HIGH (ref 70–99)
Glucose-Capillary: 224 mg/dL — ABNORMAL HIGH (ref 70–99)
Glucose-Capillary: 244 mg/dL — ABNORMAL HIGH (ref 70–99)

## 2020-07-05 LAB — PHOSPHORUS: Phosphorus: 3.4 mg/dL (ref 2.5–4.6)

## 2020-07-05 LAB — FERRITIN: Ferritin: 247 ng/mL (ref 11–307)

## 2020-07-05 LAB — ECHOCARDIOGRAM LIMITED
Area-P 1/2: 2.69 cm2
S' Lateral: 2.1 cm

## 2020-07-05 LAB — D-DIMER, QUANTITATIVE: D-Dimer, Quant: 0.78 ug/mL-FEU — ABNORMAL HIGH (ref 0.00–0.50)

## 2020-07-05 LAB — MAGNESIUM: Magnesium: 2.1 mg/dL (ref 1.7–2.4)

## 2020-07-05 MED ORDER — INSULIN ASPART 100 UNIT/ML ~~LOC~~ SOLN
0.0000 [IU] | Freq: Three times a day (TID) | SUBCUTANEOUS | Status: DC
  Administered 2020-07-05: 3 [IU] via SUBCUTANEOUS
  Administered 2020-07-05: 5 [IU] via SUBCUTANEOUS
  Administered 2020-07-05: 3 [IU] via SUBCUTANEOUS

## 2020-07-05 MED ORDER — ATORVASTATIN CALCIUM 80 MG PO TABS: 80.0000 mg | ORAL_TABLET | Freq: Every day | ORAL | 2 refills | Status: DC

## 2020-07-05 MED ORDER — ATORVASTATIN CALCIUM 80 MG PO TABS
80.0000 mg | ORAL_TABLET | Freq: Every day | ORAL | Status: DC
  Administered 2020-07-05: 80 mg via ORAL
  Filled 2020-07-05: qty 1

## 2020-07-05 MED ORDER — ASPIRIN 81 MG PO TBEC: 81.0000 mg | DELAYED_RELEASE_TABLET | Freq: Every day | ORAL | 11 refills | Status: AC

## 2020-07-05 MED ORDER — INSULIN GLARGINE 100 UNIT/ML ~~LOC~~ SOLN
10.0000 [IU] | Freq: Every day | SUBCUTANEOUS | Status: DC
  Filled 2020-07-05: qty 0.1

## 2020-07-05 MED ORDER — METFORMIN HCL 500 MG PO TABS: 1000.0000 mg | ORAL_TABLET | Freq: Two times a day (BID) | ORAL | 2 refills | Status: DC

## 2020-07-05 MED ORDER — CLOPIDOGREL BISULFATE 75 MG PO TABS: 75.0000 mg | ORAL_TABLET | Freq: Every day | ORAL | 0 refills | Status: DC

## 2020-07-05 MED FILL — metFORMIN HCL 500 MG TABS: 500 | 30 days supply | Qty: 120 | Fill #0

## 2020-07-05 MED FILL — CLOPIDOGREL 75 MG TABLET: 75 | 20 days supply | Qty: 20 | Fill #0

## 2020-07-05 MED FILL — ASPIRIN LOW DOSE 81 MG TBEC: 81 | 30 days supply | Qty: 30 | Fill #0

## 2020-07-05 MED FILL — ATORVASTATIN CALCIUM 80 MG: 80 | 30 days supply | Qty: 30 | Fill #0

## 2020-07-05 NOTE — Progress Notes (Addendum)
Subjective:   Diana Elliott states she is still having some numbness and tingling in her face that is bothering her, as she is scared she will bite her lip when chewing food. She is having some pain in her bilateral legs but this has been going on chronically. She denies any other acute complaints at this time.   Objective:  Vital signs in last 24 hours: Vitals:   07/04/20 1523 07/04/20 1954 07/05/20 0030 07/05/20 0436  BP: (!) 173/79 (!) 143/75 119/81 112/84  Pulse: 88 87 81 81  Resp: 18 19 16 16   Temp: 98.9 F (37.2 C) 99 F (37.2 C) 98.8 F (37.1 C) 98.4 F (36.9 C)  TempSrc: Oral Oral Oral Oral  SpO2: 100% 97% 100% 99%   Gen: Well-developed, well nourished, NAD HEENT: NCAT head, hearing intact, EOMI, MMM Pulm: Breathing comfortably on room air, no cough, no distress Extm: ROM intact, No peripheral edema Skin: Dry, Warm, normal turgor Neurologic exam: Moving all extremities without difficulty.  Psychiatric: Normal mood and affect  Assessment/Plan:  Principal Problem:   Cerebral infarction [facility]) Active Problems:   Diabetes mellitus type 2, diet-controlled ([place])   SARS-CoV-2 positive   Hyperlipidemia  Diana Elliott is a 75 yo F w/ PMH of T2DM presenting to [facility] with right facial paresthesia, dysarthria, headache admit for acute L thalamic infarct.  # Acute Ishcemic Left Thalamic Infarct  Likely in the setting of small vessel disease although echocardiogram still pending to evaluate for cardio-embolic source. Symptoms are gradually improving. PT has recommended outpatient follow up  - Appreciate neuro recs - Echocardiogram pending  - Follow up SLP and OT recommendations  - Telemetry - Aspirin indefinitely - Plavix for 3 weeks (End date: 07/26/2020) - Increased Atorvastatin to 80 mg daily  - Possible discharge today with close outpatient follow up  # Type 2 Diabetes Mellitus  New diagnosis on admission with A1c of 10.2%.  - SSI (moderate) - Glucose checks -  Plan to start Metformin on discharge   #SARS-CoV-2 PCR positive On admission noted to have positive SARS-CoV-2. Inflammatory markers were minimally elevated and patient is asymptomatic.  This is an incidental finding.  There is no telling how long the test has been positive, she has no symptoms, so I do not think would benefit from any interventions like monoclonal antibody or remdesivir. - Airborn/contact precautions for 10 days (End date: 07/13/2020)  - Monitor respiratory status  Bifascicular Block EKG showing bifascicular block, with no prior EKG to compare. Asymptomatic  - Telemetry  DVT prophx: Lovenox Diet: Carb modified Bowel: N/A Code: Full  Prior to Admission Living Arrangement: Home Anticipated Discharge Location: Home Barriers to Discharge: Medical work-up Dispo: Anticipated discharge in approximately 0-1 day(s).   Dr. Jose Persia Internal Medicine PGY-2  Pager: [phone] After 5pm on weekdays and 1pm on weekends: On Call pager [phone]  07/05/2020, 6:30 AM    Internal Medicine Attending:   I saw and examined the patient. I reviewed the resident's note and I agree with the resident's findings and plan as documented in the resident's note.  Hospital day #2 for this 75 year old person admitted with right-sided paresthesias due to a acute small left thalamic cerebral infarction.  Evaluation so far has shown a small stenosis in the left PCA, but overall we think that this is due to small vessel disease and best managed medically.  Greatly appreciate neurology consultation.  Planning for dual antiplatelet therapy for 3 weeks, followed by aspirin indefinitely.  Atorvastatin for hyperlipidemia.  Metformin at discharge for management of diabetes.  She will follow up with [place] in the [facility].  Incidentally noted was a positive SARS-CoV-2 PCR.  She was asymptomatic and did not require treatment.  Erlinda Hong, MD

## 2020-07-05 NOTE — Evaluation (Signed)
Physical Therapy Evaluation Patient Details Name: Diana Elliott MRN: [id number] DOB: [birth year] Today's Date: 07/05/2020   History of Present Illness  Pt is a 75 year old female with diabetes type II, bipolar disorder, and hyperlipidemia who presents with dysarthria, R-sided weakness, and R facial numbness that began 2 days prior to admission. Pt also found to positive for SARS-CoV-2 virus. 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 NIHSS of 3.  Clinical Impression  Pt presents with condition mentioned above and deficits mentioned below, see PT Problem List. PTA pt lives majority of time with her adult daughter and has 24/7 assistance available from total of 3 daughters. PTA pt was independent with all functional mob without AD/AE and received transportation from daughters. Pt demonstrates symmetrical mild strength deficits in bilat legs along with intact sensation to light touch and dynamic proprioception in bilat legs. 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 Pt is capable of performing all bed mob independently, transfers with min guard, and gait without AD with min guard for safety. No overt LOB with gait but decreased R stance phase and thus decreased L step length. Will continue to follow acutely. Recommending follow-up with outpatient PT to maximize pt safety and independence with functional mobility.    Follow Up Recommendations Outpatient PT;Supervision for mobility/OOB    Equipment Recommendations  None recommended by PT    Recommendations for Other Services       Precautions / Restrictions Precautions Precautions: Fall Precaution Comments: Airborne and contact precuations (COVID +) Restrictions Weight Bearing Restrictions: No      Mobility  Bed Mobility Overal bed mobility: Independent              General bed mobility comments: Pt able to transition supine > sit EOB without use of bed rails or controls safely.    Transfers Overall transfer level: Needs assistance Equipment used: Rolling walker (2 wheeled) Transfers: Sit to/from Stand Sit to Stand: Min guard         General transfer comment: Cues for hand placement as pt tends to try to keep hands on RW to power up to stand. Min guard for safety, no overt LOB.  Ambulation/Gait Ambulation/Gait assistance: Min guard Gait Distance (Feet): 55 Feet (x2 bouts of ~15 ft > ~55 ft within room due to isolation precautions) Assistive device: Rolling walker (2 wheeled);None Gait Pattern/deviations: Step-through pattern;Decreased stride length;Decreased step length - right;Decreased stance time - right;Narrow base of support Gait velocity: reduced Gait velocity interpretation: <1.8 ft/sec, indicate of risk for recurrent falls General Gait Details: Ambulates at slow pace with mild trunk sway laterally, no overt LOB. Progressed from utilizing RW to no AD with no significant change in balance but a noted decreased L step length with decreased R stance time. Min guard for safety.  Stairs            Wheelchair Mobility    Modified Rankin (Stroke Patients Only) Modified Rankin (Stroke Patients Only) Pre-Morbid Rankin Score: No symptoms Modified Rankin: Moderately severe disability     Balance Overall balance assessment: Needs assistance Sitting-balance support: No upper extremity supported;Feet supported Sitting balance-Leahy Scale: Good Sitting balance - Comments: No UE support reaching off BOS, supervision for safety sitting EOB and on commode.   Standing balance support: Bilateral upper extremity supported;During functional activity;No upper extremity supported Standing balance-Leahy Scale: Good Standing balance comment: Able  to reach mod off BOS to wash hands at sink without UE support without overt LOB, mild trunk sway noted  with gait min guard for safety.     Tandem Stance - Right Leg: 10 (>/= 10 seconds, increased trunk sway, eyes open)   Rhomberg - Eyes Opened: 10 (>/= 10 seconds, mild trunk sway)     High Level Balance Comments: Semi-tandem stance >/= 10 seconds eyes open, did not like trying to close eyes, mild trunk sway             Pertinent Vitals/Pain Pain Assessment: 0-10 Pain Score: 5  Pain Location: R side of face Pain Descriptors / Indicators: Numbness Pain Intervention(s): Limited activity within patient's tolerance;Monitored during session;Repositioned    Home Living Family/patient expects to be discharged to:: Private residence Living Arrangements: Children (Daughter ~53 y.o.) Available Help at Discharge: Family;Available 24 hours/day (2 additional daughters who help when primary caregiver daughter is at work) Type of Home: House Home Access: Stairs to enter Entrance Stairs-Rails: None Entrance Stairs-Number of Steps: 2 Home Layout: Two level;Able to live on main level with bedroom/bathroom;Bed/bath upstairs Home Equipment: None      Prior Function Level of Independence: Independent         Comments: Pt's daughters drive her to appointments.     Hand Dominance   Dominant Hand: Right    Extremity/Trunk Assessment   Upper Extremity Assessment Upper Extremity Assessment: Defer to OT evaluation    Lower Extremity Assessment Lower Extremity Assessment: RLE deficits/detail;LLE deficits/detail RLE Deficits / Details: MMT scores of 4- to 4+ grossly throughout RLE Sensation: WNL RLE Coordination: decreased fine motor;decreased gross motor (dysdiadochokinesia noted) LLE Deficits / Details: MMT scores of 4- to 4+ grossly throughout LLE Sensation: WNL LLE Coordination: decreased gross motor    Cervical / Trunk Assessment Cervical / Trunk Assessment: Normal  Communication   Communication: No difficulties  Cognition Arousal/Alertness: Awake/alert Behavior During  Therapy: WFL for tasks assessed/performed Overall Cognitive Status: Impaired/Different from baseline Area of Impairment: Following commands;Safety/judgement;Awareness;Problem solving                       Following Commands: Follows one step commands consistently;Follows one step commands with increased time;Follows multi-step commands with increased time Safety/Judgement: Decreased awareness of safety;Decreased awareness of deficits Awareness: Emergent Problem Solving: Slow processing;Difficulty sequencing;Requires verbal cues General Comments: A&Ox4. Pt with decreased speed processing cues and sequencing tasks. Poor awarenss of deficits impacting safety.      General Comments General comments (skin integrity, edema, etc.): SpO2 >/= 98% throughout; HR 90s throughout    Exercises     Assessment/Plan    PT Assessment Patient needs continued PT services  PT Problem List Decreased balance;Decreased mobility;Decreased coordination;Decreased safety awareness;Decreased activity tolerance       PT Treatment Interventions DME instruction;Gait training;Stair training;Functional mobility training;Therapeutic activities;Therapeutic exercise;Balance training;Neuromuscular re-education;Cognitive remediation;Patient/family education    PT Goals (Current goals can be found in the Care Plan section)  Acute Rehab PT Goals Patient Stated Goal: to improve her balance PT Goal Formulation: With patient Time For Goal Achievement: 07/19/20 Potential to Achieve Goals: Good    Frequency Min 3X/week   Barriers to discharge        Co-evaluation               AM-PAC PT "6 Clicks" Mobility  Outcome Measure Help needed turning from your back to your side while in a flat bed without using bedrails?: None Help needed moving from lying  on your back to sitting on the side of a flat bed without using bedrails?: None Help needed moving to and from a bed to a chair (including a wheelchair)?:  A Little Help needed standing up from a chair using your arms (e.g., wheelchair or bedside chair)?: A Little Help needed to walk in hospital room?: A Little Help needed climbing 3-5 steps with a railing? : A Little 6 Click Score: 20    End of Session Equipment Utilized During Treatment: Gait belt Activity Tolerance: Patient tolerated treatment well Patient left: in chair;with call bell/phone within reach;with chair alarm set Nurse Communication: Mobility status PT Visit Diagnosis: Unsteadiness on feet (R26.81);Other abnormalities of gait and mobility (R26.89);Difficulty in walking, not elsewhere classified (R26.2);Other symptoms and signs involving the nervous system (R29.898)    Time: 1696-7893 PT Time Calculation (min) (ACUTE ONLY): 47 min   Charges:   PT Evaluation $PT Eval Moderate Complexity: 1 Mod PT Treatments $Gait Training: 8-22 mins $Therapeutic Activity: 8-22 mins        Raymond Gurney, PT, DPT Acute Rehabilitation Services  Pager: [phone] Office: [phone]   Jewel Baize 07/05/2020, 9:39 AM

## 2020-07-05 NOTE — Evaluation (Signed)
Speech Language Pathology Evaluation Patient Details Name: Diana Elliott MRN: [id number] DOB: 1946/02/24 Today's Date: 07/05/2020 Time: [id number] SLP Time Calculation (min) (ACUTE ONLY): 18 min  Problem List:  Patient Active Problem List   Diagnosis Date Noted  . Hyperlipidemia 07/04/2020  . Cerebrovascular accident (CVA) ([place])   . Cerebral infarction ([place]) 07/03/2020  . COVID-19 virus infection 07/03/2020  . Diabetes mellitus type 2, diet-controlled ([place]) 11/19/2015   Past Medical History: History reviewed. No pertinent past medical history. Past Surgical History:  Past Surgical History:  Procedure Laterality Date  . TUBAL LIGATION     HPI:  75 year old person living with diabetes and hyperlipidemia admitted with an acute cerebral infarction of the left thalamus causing a right sided paresthesia.  CT angiogram of the head and neck did not show any large vessel disease. Patient also incidentally found positive for SARS-CoV-2 virus. MRI revealed on 07/04/19 Acute or early subacute infarct in the ventrolateral left thalamus  with extension inferiorly to involve the lateral left midbrain. Mild associated edema without mass effect; SLE generated.  Assessment / Plan / Recommendation Clinical Impression  Pt administered the SLUMS ([place] Mental Status Examination) with a score of 16/26 with a typical score being 27/30 with all subtests given.  Pt's handedness affected with right parasthesia; consequently, writing portion eliminated as noted in reduction of total score.  Pt exhibited deficits in the areas of attention, memory, organization, and auditory comprehension.  Pt had difficulty with simple calculation task, word retrieval, word fluency and answering questions from a short paragraph.  No family available to determine prior level of cognitive functioning.  Pt did state she "couldn't chew on one side of her mouth because she kept biting it" suggesting potential sensory  impairment.  Pt would benefit from home health speech therapy for cognitive deficits as discharge from hospital is imminent.  Thank you for this consult.    SLP Assessment  SLP Recommendation/Assessment: All further Speech Language Pathology  needs can be addressed in the next venue of care SLP Visit Diagnosis: Attention and concentration deficit;Cognitive communication deficit (R41.841);Dysarthria and anarthria (R47.1) Attention and concentration deficit following: Cerebral infarction    Follow Up Recommendations  Home health SLP    Frequency and Duration     Evaluation only      SLP Evaluation Cognition  Overall Cognitive Status: Impaired/Different from baseline Orientation Level: Oriented X4 Attention: Sustained Sustained Attention: Impaired Sustained Attention Impairment: Verbal basic;Functional basic Memory: Impaired Memory Impairment: Decreased recall of new information;Retrieval deficit Immediate Memory Recall: Sock;Blue;Bed Memory Recall Sock: Without Cue Memory Recall Blue: With Cue Memory Recall Bed: With Cue       Comprehension  Auditory Comprehension Overall Auditory Comprehension: Impaired Commands: Impaired Multistep Basic Commands: 25-49% accurate Conversation: Simple Interfering Components: Attention;Working Curator: Within [employer] Reading Comprehension Reading Status: Not tested (Pt able to read environmental signs in room)    Expression Expression Primary Mode of Expression: Verbal Verbal Expression Overall Verbal Expression: Appears within functional limits for tasks assessed Level of Generative/Spontaneous Verbalization: Conversation Repetition: No impairment Naming: No impairment Non-Verbal Means of Communication: Not applicable Written Expression Dominant Hand: Right Written Expression: Not tested   Oral / Motor  Oral Motor/Sensory Function Overall Oral Motor/Sensory Function: Mild  impairment Facial Sensation: Reduced right Motor Speech Overall Motor Speech: Appears within functional limits for tasks assessed Respiration: Within functional limits Phonation: Normal Resonance: Hypernasality (min) Articulation: Impaired Level of Impairment: Sentence Intelligibility: Intelligibility reduced Word: 75-100% accurate Phrase: 75-100%  accurate Sentence: 75-100% accurate Conversation: 75-100% accurate Motor Planning: Witnin functional limits Motor Speech Errors: Not applicable                       Tressie Stalker, M.S., CCC-SLP 07/05/2020, 3:32 PM

## 2020-07-05 NOTE — Telephone Encounter (Signed)
Error with time ... correct time is 8:45 am

## 2020-07-05 NOTE — Telephone Encounter (Signed)
Dr Huel Cote  Made an new pt appt for pt  ... pt is in quarantine until the 17th  .Marland Kitchen Appt is 07/17/20@3 :15  discharge date unknown

## 2020-07-05 NOTE — Discharge Summary (Signed)
Name: Diana Elliott MRN: [id number] DOB: [birth year] 75 y.o. PCP: Patient, No Pcp Per  Date of Admission: 07/02/2020  5:59 PM Date of Discharge: 07/05/2020 Attending Physician: Axel Filler, *  Discharge Diagnosis: 1. Acute Ischemic Left Thalamic Infarct  2. Hypercholesterolemia  3. Type 2 Diabetes Mellitus  4. Bifascicular Block  5. COVID-19 Infection  Discharge Medications: Allergies as of 07/05/2020   No Known Allergies     Medication List    STOP taking these medications   BLACK CURRANT SEED OIL PO   TURMERIC PO     TAKE these medications   AMBULATORY NON FORMULARY MEDICATION Medication Name: Nitroglycerin ointment 0.125% use pea sized amount 4 times a day per rectum  I   aspirin 81 MG EC tablet Take 1 tablet (81 mg total) by mouth daily. Swallow whole. Start taking on: July 06, 2020   atorvastatin 80 MG tablet Commonly known as: LIPITOR Take 1 tablet (80 mg total) by mouth daily. Start taking on: July 06, 2020   b complex vitamins tablet Take 1 tablet by mouth daily.   clopidogrel 75 MG tablet Commonly known as: PLAVIX Take 1 tablet (75 mg total) by mouth daily for 20 days. Start taking on: July 06, 2020   metFORMIN 500 MG tablet Commonly known as: Glucophage Take 2 tablets (1,000 mg total) by mouth 2 (two) times daily with a meal. Please see discharge paperwork for instructions on how to take.   OMEGA 3 PO Take 1 capsule by mouth daily.   VITAMIN D PO Take 1 tablet by mouth daily.       Disposition and follow-up:   Diana Elliott was discharged from [facility] in Good condition.  At the hospital follow up visit please address:  1.  DM/Stroke prevention- will need close follow up regarding DM and glucose control. Also started on DAPT for 21 days and then to continue ASA alone.   2.  Labs / imaging needed at time of follow-up: None   3.  Pending labs/ test needing follow-up: None   Follow-up  Appointments:  Follow-up Information    Jeralyn Bennett, MD. Go on 07/17/2020.   Specialty: Internal Medicine Why: Appointment at 8:45 AM. Please arrive 15 minutes early at 8:30 AM. Enter the building at the [facility] and you will be directed to the bottom floor, where our clinic is located.  Contact information: [place] [id number] [place] Hospital Course by problem list:  1. Acute Ischemic Left Thalamic Infarct - presented with right sided numbness and weakness, found to have a left thalamic stroke w/extension into the midbrain. She was outside the time window to receive IVTPA and thrombectomy. CTH ruled out hemorrhage but revealedage-indeterminate left thalamic infarct. MRI Brain w/acute or early subacute infarct in the ventrolateral left thalamus with extension inferiorly to involve the lateral left midbrain. CTA Head and Neck w/ no significant carotid bifurcation disease, moderate stenotic disease of the left PCA and aortic atherosclerosis. Stroke labs w/A1C 10.2, elevated cholesterol 294, triglycerides 164, HDL 33 and LDL 228. Echo showed EF 70 to 75%, no regional wall abnormalities, grade I diastolic dysfunction. Stroke etiology likely due to small vessel disease given uncontrolled diabetes. She was started on DAPT and atorvastatin 40 mg for secondary stroke prevention. DAPT for 21 days (start 1/5, end 1/26) followed by ASA monotherapy.   2. Hypercholesterolemia - lipid panel as above, started  on atorvastatin 40 mg daily.   3. Type 2 Diabetes Mellitus - A1c 10.2, goal <7. Started on metformin at discharge at discharge.   4. Bifascicular Block - EKG showed bifascicular block, no priors to compare. Asymptomatic during hospital course.    5. COVID-19 Infection- found to be Covid positive though she does not have any active symptoms     Discharge Vitals:   BP 115/82 (BP Location: Left Arm)   Pulse 74   Temp 98.6 F (37 C) (Oral)   Resp 15    SpO2 91%   Pertinent Labs, Studies, and Procedures:  CMP Latest Ref Rng & Units 07/05/2020 07/04/2020 07/02/2020  Glucose 70 - 99 mg/dL 892(J) 194(R) 740(C)  BUN 8 - 23 mg/dL 11 12 13   Creatinine 0.44 - 1.00 mg/dL 1.44 8.18  Sodium 135 - 145 mmol/L 137 137 135  Potassium 3.5 - 5.1 mmol/L 3.8 4.0 4.0  Chloride 98 - 111 mmol/L 105 104 100  CO2 22 - 32 mmol/L 23 24 -  Calcium 8.9 - 10.3 mg/dL 5.63) 1.4(H) -  Total Protein 6.5 - 8.1 g/dL 6.4(L) 6.3(L) -  Total Bilirubin 0.3 - 1.2 mg/dL 1.1 7.0(Y) -  Alkaline Phos 38 - 126 U/L 97 100 -  AST 15 - 41 U/L 14(L) 15 -  ALT 0 - 44 U/L 15 14 -   CBC Latest Ref Rng & Units 07/05/2020 07/04/2020 07/02/2020  WBC 4.0 - 10.5 K/uL 5.6 4.4 -  Hemoglobin 12.0 - 15.0 g/dL 08/30/2020 85.8 85.0  Hematocrit 36.0 - 46.0 % 38.7 41.4 43.0  Platelets 150 - 400 K/uL 321 297 -   COVID-19 Labs  Recent Labs    07/04/20 0412 07/05/20 0428  DDIMER 0.76* 0.78*  FERRITIN 265 247  CRP 1.3* 0.8   Lab Results  Component Value Date   SARSCOV2NAA POSITIVE (A) 07/03/2020   SARSCOV2NAA RESULT:  NEGATIVE 07/07/2019   CT Head (07/03/20)  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 This could be further assessed with MRI examination, if indicated. No superimposed acute Hemorrhage.  MRI (07/03/20)  Acute or early subacute infarct in the ventrolateral left thalamus with extension inferiorly to involve the lateral left midbrain. Mild associated edema without mass effect.  CTA Head/Neck w/wo contrast (07/04/20)  1. No large or medium vessel occlusion. 2. No significant carotid bifurcation disease. 3. 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 4. Aortic atherosclerosis.  CXR (07/03/20)  1. Stable cardiomegaly. No pulmonary venous congestion. 2. Low lung volumes with mild bibasilar atelectasis. Mild bilateral interstitial prominence. Mild pneumonitis cannot be excluded.  Discharge Instructions: Discharge Instructions    Diet - low sodium heart healthy   Complete by: As  directed    Increase activity slowly   Complete by: As directed       Signed: 08/31/20, DO Internal Medicine PGY-2   07/05/2020, 3:48 PM

## 2020-07-05 NOTE — TOC Transition Note (Signed)
Transition of Care [facility]) - CM/SW Discharge Note   Patient Details  Name: Diana Elliott MRN: [id number] Date of Birth: Jun 05, 1946  Transition of [facility]) CM/SW Contact:  Kermit Balo, RN Phone Number: 07/05/2020, 4:17 PM   Clinical Narrative:    Pt is discharging home with [facility] services. CM has asked [facility] to see if able to provide the services.  No DME needs.  Pt has transportation home.   Final next level of care: Home w Home Health Services Barriers to Discharge: No Barriers Identified   Patient Goals and CMS Choice   CMS Medicare.gov Compare Post Acute Care list provided to:: Patient Choice offered to / list presented to : Patient  Discharge Placement                       Discharge Plan and Services                          HH Arranged: PT [facility] Agency: [facility] Date [facility] Agency Contacted: 07/05/20   Representative spoke with at [facility] Agency: [facility] Grippe  Social Determinants of Health (SDOH) Interventions     Readmission Risk Interventions No flowsheet data found.

## 2020-07-05 NOTE — Evaluation (Signed)
Occupational Therapy Evaluation Patient Details Name: Diana Elliott MRN: [id number] DOB: May 08, 1946 Today's Date: 07/05/2020    History of Present Illness Pt is a 75 year old female with diabetes type II, bipolar disorder, and hyperlipidemia who presents with dysarthria, R-sided weakness, and R facial numbness that began 2 days prior to admission. Pt also found to positive for SARS-CoV-2 virus. 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 NIHSS of 3.   Clinical Impression   PTA, pt was living at home with her daughter who is available 24/7, pt reports she was independent with ADL/IADL and functional mobility and her daughter would driver her to appointments. Pt currently requires minguard for toileting, grooming, and LB dressing. Pt ambulated in room without RW and required minguard assistance. Pt demonstrates cognitive limitations (see cognition section) impacting safety with ADL completion. Due to decline in current level of function, pt would benefit from acute OT to address established goals to facilitate safe D/C to venue listed below. At this time, recommend HHOT follow-up. Will continue to follow acutely.     Follow Up Recommendations  Home health OT;Supervision/Assistance - 24 hour    Equipment Recommendations  None recommended by OT    Recommendations for Other Services       Precautions / Restrictions Precautions Precautions: Fall Precaution Comments: Airborne and contact precuations (COVID +) Restrictions Weight Bearing Restrictions: No      Mobility Bed Mobility Overal bed mobility: Independent             General bed mobility comments: Pt able to transition supine > sit EOB without use of bed rails or controls safely.    Transfers Overall transfer level: Needs assistance Equipment used: Rolling walker (2 wheeled) Transfers: Sit to/from Stand Sit to Stand: Min guard          General transfer comment: cues for hand placement;utilized rW initially then pt progressed to not using RW;completed 10 sit<>stand without use of RW 1x loss of balance, pt able to correct    Balance Overall balance assessment: Needs assistance Sitting-balance support: No upper extremity supported;Feet supported Sitting balance-Leahy Scale: Good Sitting balance - Comments: no UE support;good dynamic sitting balance   Standing balance support: No upper extremity supported;During functional activity Standing balance-Leahy Scale: Fair Standing balance comment: able to stand unsupported, pt ambulated without AD mild instability no loss of balance                           ADL either performed or assessed with clinical judgement   ADL Overall ADL's : Needs assistance/impaired Eating/Feeding: Set up;Sitting   Grooming: Min guard;Standing   Upper Body Bathing: Set up;Sitting   Lower Body Bathing: Min guard;Sit to/from stand   Upper Body Dressing : Set up;Sitting   Lower Body Dressing: Min guard;Sit to/from stand   Toilet Transfer: Min guard;Ambulation Toilet Transfer Details (indicate cue type and reason): ambulated to regular height commode without AD Toileting- Clothing Manipulation and Hygiene: Min guard;Sit to/from stand       Functional mobility during ADLs: Min guard General ADL Comments: minguard for safety;VSS throughout SpO2 99% RA     Vision Baseline Vision/History: Wears glasses Wears Glasses: Reading only Patient Visual Report: No change from baseline Vision Assessment?: Yes Eye Alignment: Within Functional Limits Ocular Range of Motion: Within Functional Limits Alignment/Gaze Preference: Within Defined Limits Tracking/Visual Pursuits: Decreased smoothness of horizontal tracking;Decreased smoothness of  vertical tracking Saccades: Additional head turns occurred during testing;Additional eye shifts occurred during testing Convergence: Within functional  limits Visual Fields: No apparent deficits     Perception     Praxis      Pertinent Vitals/Pain Pain Assessment: No/denies pain Pain Intervention(s): Monitored during session     Hand Dominance Right   Extremity/Trunk Assessment Upper Extremity Assessment Upper Extremity Assessment: RUE deficits/detail;LUE deficits/detail RUE Deficits / Details: grossly 3-/5;pt reports decreased sensation, able to use functionally RUE Sensation: decreased light touch RUE Coordination: WNL LUE Deficits / Details: 4/5 grossly, full ROM, sensation intact   Lower Extremity Assessment Lower Extremity Assessment: Defer to PT evaluation RLE Coordination:  (dysdiadochokinesia noted)   Cervical / Trunk Assessment Cervical / Trunk Assessment: Normal   Communication Communication Communication: No difficulties   Cognition Arousal/Alertness: Awake/alert Behavior During Therapy: WFL for tasks assessed/performed Overall Cognitive Status: Impaired/Different from baseline Area of Impairment: Following commands;Safety/judgement;Awareness;Problem solving                       Following Commands: Follows one step commands consistently;Follows one step commands with increased time;Follows multi-step commands with increased time Safety/Judgement: Decreased awareness of safety;Decreased awareness of deficits Awareness: Emergent Problem Solving: Slow processing;Difficulty sequencing;Requires verbal cues General Comments: A&Ox4. Pt required increased time for processing information, cues for sequencing tasks and demonstrated decreased awareness of deficits   General Comments  SpO2 >99% throughout session;HR 103 bpm    Exercises     Shoulder Instructions      Home Living Family/patient expects to be discharged to:: Private residence Living Arrangements: Children (Daughter ~60 y.o.) Available Help at Discharge: Family;Available 24 hours/day Type of Home: House Home Access: Stairs to  enter [employer] of Steps: 2 Entrance Stairs-Rails: None Home Layout: Two level;Able to live on main level with bedroom/bathroom;Bed/bath upstairs Alternate Level Stairs-Number of Steps: 15 Alternate Level Stairs-Rails: Can reach both Bathroom Shower/Tub: Teacher, early years/pre: Standard Bathroom Accessibility: No   Home Equipment: None      Lives With: Daughter (Pt stated she has a senior apartment, but stays with daughter sometimes)    Prior Functioning/Environment Level of Independence: Independent        Comments: Pt's daughters drive her to appointments.        OT Problem List: Decreased activity tolerance;Impaired balance (sitting and/or standing);Decreased cognition;Decreased safety awareness      OT Treatment/Interventions: Self-care/ADL training;Therapeutic exercise;DME and/or AE instruction;Therapeutic activities;Cognitive remediation/compensation;Patient/family education;Balance training    OT Goals(Current goals can be found in the care plan section) Acute Rehab OT Goals Patient Stated Goal: to improve her balance OT Goal Formulation: With patient Time For Goal Achievement: 07/19/20 Potential to Achieve Goals: Good ADL Goals Pt Will Perform Grooming: with modified independence;standing Pt Will Perform Lower Body Dressing: with modified independence;sit to/from stand Pt Will Transfer to Toilet: with modified independence;ambulating  OT Frequency: Min 2X/week   Barriers to D/C:            Co-evaluation              AM-PAC OT "6 Clicks" Daily Activity     Outcome Measure Help from another person eating meals?: None Help from another person taking care of personal grooming?: A Little Help from another person toileting, which includes using toliet, bedpan, or urinal?: A Little Help from another person bathing (including washing, rinsing, drying)?: A Little Help from another person to put on and taking off regular upper body  clothing?: A Little  Help from another person to put on and taking off regular lower body clothing?: A Little 6 Click Score: 19   End of Session Equipment Utilized During Treatment: Gait belt;Rolling walker Nurse Communication: Mobility status  Activity Tolerance: Patient tolerated treatment well Patient left: in bed;with call bell/phone within reach;with bed alarm set  OT Visit Diagnosis: Unsteadiness on feet (R26.81);Other abnormalities of gait and mobility (R26.89);Muscle weakness (generalized) (M62.81);Other symptoms and signs involving cognitive function                Time: [id number] OT Time Calculation (min): 34 min Charges:  OT General Charges $OT Visit: 1 Visit OT Evaluation $OT Eval Moderate Complexity: 1 Mod OT Treatments $Self Care/Home Management : 8-22 mins  Helene Kelp OTR/L Acute Rehabilitation Services Office: [place] 07/05/2020, 6:17 PM

## 2020-07-05 NOTE — Progress Notes (Signed)
  Echocardiogram 2D Echocardiogram has been performed.  Diana Elliott 07/05/2020, 2:02 PM

## 2020-07-09 ENCOUNTER — Telehealth: Payer: Self-pay

## 2020-07-09 NOTE — Telephone Encounter (Signed)
Returned call to [place], PT with [place]. Verbal auth given for [facility] PT 2 week 3 and 1 week 1 to work on balance, strength, and functional mobility following recent CVA. Patient has HFU appt with [facility] on 07/17/2020 and will establish care. Patient has pending Speech and OT evals and Clair Gulling was given VO to add [facility] skilled nursing eval as well. Will route to [facility] for agreement/denial.  Clair Gulling also wanted to report patient c/o chronic hemorrhoid pain with minimal bleeding; patient rates pain 10/10 but Clair Gulling states, based on her functional ability, it is more like a 4-5/10. She has chronic urinary incontinence but now with fecal incontinence after starting metformin (loose stools). She is having some difficulty eating 2/2 pain on the right side of her mouth. Unsure if this is due to a tooth or post CVA. Patient has long term cognitive impairment that was present prior to recent CVA. Patient lives with daughter, Alden Benjamin, who works from home. She is in the process of  obtaining POA. Hubbard Hartshorn, BSN, RN-BC

## 2020-07-09 NOTE — Telephone Encounter (Signed)
Pls contact Jim from [place] for VO [phone]

## 2020-07-10 NOTE — Telephone Encounter (Signed)
Returned call to patient/daughter to [employer] below. No answer. Left message on VM requesting return call. Hubbard Hartshorn, BSN, RN-BC

## 2020-07-13 ENCOUNTER — Telehealth: Payer: Self-pay | Admitting: *Deleted

## 2020-07-13 NOTE — Telephone Encounter (Signed)
Returned call to Timmothy Sours, [place] with [place]. No answer. Left message on VM requesting return call. Hubbard Hartshorn, BSN, RN-BC

## 2020-07-13 NOTE — Telephone Encounter (Signed)
Beth, RN with [facility] called in requesting VO for [facility] Skilled Nursing 1 week 4 to work on disease and med management. Verbal auth given. Will route to [facility] for agreement/denial.   Eustaquio Maize states that patient had no c/o pain for her. She was living alone and still driving prior to recent CVA. She is having trouble getting words out but she did not note any significant cognitive impairment as relayed by [facility] PT. L. Marylan Glore, BSN, RN-BC

## 2020-07-13 NOTE — Telephone Encounter (Signed)
Don with [facility] [phone], requesting VO for OT. Pt decline a visit for today. Please call pt back.

## 2020-07-13 NOTE — Telephone Encounter (Signed)
Don returned call. Patient declined visit today. VO given to do OT eval next week. Hubbard Hartshorn, BSN, RN-BC

## 2020-07-17 ENCOUNTER — Encounter: Payer: Medicare Other | Admitting: Student

## 2020-07-18 ENCOUNTER — Other Ambulatory Visit: Payer: Self-pay | Admitting: Student

## 2020-07-18 ENCOUNTER — Telehealth: Payer: Self-pay | Admitting: *Deleted

## 2020-07-18 DIAGNOSIS — E119 Type 2 diabetes mellitus without complications: Secondary | ICD-10-CM

## 2020-07-18 MED ORDER — METFORMIN HCL ER 500 MG PO TB24: 500.0000 mg | ORAL_TABLET | Freq: Every day | ORAL | 2 refills | Status: DC

## 2020-07-18 NOTE — Telephone Encounter (Signed)
Call from pt's daughter, Diana Elliott -stated pt is having bad side effects from Metfomin 500 mg 2 tabs BID; pt is having nausea, vomiting and diarrhea. Asking if doasge may be decreased.  Also she's having loss of appetite, daughter stated she has to make the pt eat; thinks it may be from Lipitor or plavix. Also OT is currently at the home, Timmothy Sours from [facility]. Requesting verbal order "To continue OT for once a week x 2 weeks for exercise, transfer, and IADL's". VO given - if not appropriate, let me know.

## 2020-07-19 NOTE — Telephone Encounter (Signed)
Transition Care Management Follow-up Telephone Call  Date of discharge and from where?  Discharged home with her daughter on1/6 29.  How have you been since you were released from the hospital? Talked to pt's daughter yesterday. They had questions and concerns about the medications. See encounter; Dr Konrad Penta had called the pt. Today her daughter mentioned sometimes pt c/o arthritis pain at night and would take Tylenol sometimes.  Items Reviewed:  Did the pt receive and understand the discharge instructions provided? Yes   Medications obtained and verified? Yes   Any new allergies since your discharge? No   Dietary orders reviewed? Yes  Do you have support at home? Yes , currently staying with her daughter.  Home Care and Equipment/Supplies: No HH was ordered.  Functional Questionnaire: (I = Independent and D = Dependent) ADLs: Independent.  Bathing/Dressing-  Independent.  Meal Prep- Her daughter helps.  Eating-  Independent.  Maintaining continence-  Independent.  Transferring/Ambulation-  Independent. But daughter thinks pt may need a cane and possible a rolator to encourage more independence and "doing things".  Managing Meds - Her daughter helps; uses a pill box.  Follow up appointments reviewed:   PCP Hospital f/u appt confirmed? Yes  Scheduled to see Dr Konrad Penta on 07/27/20 @ 0915 AM.  .  Are transportation arrangements needed? No   If their condition worsens, is the pt aware to call PCP or go to the Emergency Dept.? Yes  Was the patient provided with contact information for the PCP's office or ED? Yes  Was to pt encouraged to call back with questions or concerns? Yes

## 2020-07-20 ENCOUNTER — Telehealth: Payer: Self-pay | Admitting: *Deleted

## 2020-07-20 NOTE — Telephone Encounter (Signed)
Call from [facility], [place] - stated he completed evaluation on Wednesday. Requesting verbal order for "OT once a week x 2 weeks". VO given - if not appropriate, let me know. Thanks

## 2020-07-20 NOTE — Telephone Encounter (Signed)
I agree, thank you.

## 2020-07-24 ENCOUNTER — Telehealth: Payer: Self-pay

## 2020-07-24 NOTE — Telephone Encounter (Signed)
Beth returned call. States patient declined RN visit this week. Weekly visits will resume next week.  Also, patient reported to her that she fell last week going up the stairs. Denied any injury. Hubbard Hartshorn, BSN, RN-BC

## 2020-07-24 NOTE — Telephone Encounter (Signed)
Pls contact Beth at [place] [place]

## 2020-07-24 NOTE — Telephone Encounter (Signed)
Returned call to [employer], Therapist, sports with [facility]. No answer. Left message on VM requesting return call. Hubbard Hartshorn, BSN, RN-BC

## 2020-07-27 ENCOUNTER — Other Ambulatory Visit: Payer: Self-pay

## 2020-07-27 ENCOUNTER — Encounter: Payer: Self-pay | Admitting: Student

## 2020-07-27 ENCOUNTER — Ambulatory Visit (INDEPENDENT_AMBULATORY_CARE_PROVIDER_SITE_OTHER): Payer: Medicare Other | Admitting: Student

## 2020-07-27 VITALS — BP 125/76 | HR 88 | Temp 97.9°F | Ht 63.0 in | Wt 161.9 lb

## 2020-07-27 DIAGNOSIS — Z8719 Personal history of other diseases of the digestive system: Secondary | ICD-10-CM | POA: Diagnosis not present

## 2020-07-27 DIAGNOSIS — E785 Hyperlipidemia, unspecified: Secondary | ICD-10-CM

## 2020-07-27 DIAGNOSIS — I5030 Unspecified diastolic (congestive) heart failure: Secondary | ICD-10-CM

## 2020-07-27 DIAGNOSIS — U071 COVID-19: Secondary | ICD-10-CM

## 2020-07-27 DIAGNOSIS — I998 Other disorder of circulatory system: Secondary | ICD-10-CM | POA: Insufficient documentation

## 2020-07-27 DIAGNOSIS — R0989 Other specified symptoms and signs involving the circulatory and respiratory systems: Secondary | ICD-10-CM

## 2020-07-27 DIAGNOSIS — E1159 Type 2 diabetes mellitus with other circulatory complications: Secondary | ICD-10-CM

## 2020-07-27 DIAGNOSIS — R718 Other abnormality of red blood cells: Secondary | ICD-10-CM | POA: Diagnosis not present

## 2020-07-27 DIAGNOSIS — W19XXXA Unspecified fall, initial encounter: Secondary | ICD-10-CM

## 2020-07-27 DIAGNOSIS — I63532 Cerebral infarction due to unspecified occlusion or stenosis of left posterior cerebral artery: Secondary | ICD-10-CM

## 2020-07-27 DIAGNOSIS — E119 Type 2 diabetes mellitus without complications: Secondary | ICD-10-CM

## 2020-07-27 LAB — GLUCOSE, CAPILLARY: Glucose-Capillary: 265 mg/dL — ABNORMAL HIGH (ref 70–99)

## 2020-07-27 MED ORDER — ATORVASTATIN CALCIUM 80 MG PO TABS: 80.0000 mg | ORAL_TABLET | Freq: Every day | ORAL | 2 refills | Status: DC

## 2020-07-27 MED ORDER — GLIPIZIDE 5 MG PO TABS
5.0000 mg | ORAL_TABLET | Freq: Every day | ORAL | 3 refills | Status: DC
Start: 2020-07-27 — End: 2020-10-16

## 2020-07-27 NOTE — Patient Instructions (Addendum)
Diana Elliott,   I am glad to hear that you continue to better after your stroke and I am so sorry to hear about your sister!  Your blood sugar remains elevated here today. Sometimes this can occur after COVID-19 and I suspect your decreased appetite is from the same.   Please STOP taking metformin  Please START taking glipizide 5mg  once daily, first thing in the morning  Please continue taking Atorvastatin and baby aspirin daily. I have refilled prescriptions for an increased dose (80mg ) once daily to your pharmacy.   Please be sure to check your blood sugars every morning first thing when you wake up, before you eat breakfast. Please also check before bed and at least once during the day.  Please bring your glucometer to your follow up appointment.   You may pick up compression stockings over the counter to decrease your dizziness on standing.   We will check blood work and urine and I will call you with results.  Please tell the front desk you would like a follow up visit with [place] in 2 weeks for follow up of your sugars.   Thank you and take care!  Dr. Konrad Penta    Blood Glucose Monitoring, Adult Monitoring your blood sugar (glucose) is an important part of managing your diabetes. Blood glucose monitoring involves checking your blood glucose as often as directed and keeping a log or record of your results over time. Checking your blood glucose regularly and keeping a blood glucose log can:  Help you and your health care provider adjust your diabetes management plan as needed, including your medicines or insulin.  Help you understand how food, exercise, illnesses, and medicines affect your blood glucose.  Let you know what your blood glucose is at any time. You can quickly find out if you have low blood glucose (hypoglycemia) or high blood glucose (hyperglycemia). Your health care provider will set individualized treatment goals for you. Your goals will be based on your age, other  medical conditions you have, and how you respond to diabetes treatment. Generally, the goal of treatment is to maintain the following blood glucose levels:  Before meals (preprandial): 80-130 mg/dL (4.4-7.2 mmol/L).  After meals (postprandial): below 180 mg/dL (10 mmol/L).  A1C level: less than 7%. Supplies needed:  Blood glucose meter.  Test strips for your meter. Each meter has its own strips. You must use the strips that came with your meter.  A needle to prick your finger (lancet). Do not use a lancet more than one time.  A device that holds the lancet (lancing device).  A journal or log book to write down your results. How to check your blood glucose Checking your blood glucose 1. Wash your hands for at least 20 seconds with soap and water. 2. Prick the side of your finger (not the tip) with the lancet. Do not use the same finger consecutively. 3. Gently rub the finger until a small drop of blood appears. 4. Follow instructions that come with your meter for inserting the test strip, applying blood to the strip, and using your blood glucose meter. 5. Write down your result and any notes in your log.   Using alternative sites Some meters allow you to use areas of your body other than your finger (alternative sites) to test your blood. The most common alternative sites are the forearm, the thigh, and the palm of your hand. Alternative sites may not be as accurate as the fingers because blood flow is  slower in those areas. This means that the result you get may be delayed, and it may be different from the result that you would get from your finger. Use the finger only, and do not use alternative sites, if:  You think you have hypoglycemia.  You sometimes do not know that your blood glucose is getting low (hypoglycemia unawareness). General tips and recommendations Blood glucose log  Every time you check your blood glucose, write down your result. Also write down any notes about  things that may be affecting your blood glucose, such as your diet and exercise for the day. This information can help you and your health care provider: ? Look for patterns in your blood glucose over time. ? Adjust your diabetes management plan as needed.  Check if your meter allows you to download your records to a computer or if there is an app for the meter. Most glucose meters store a record of glucose readings in the meter.   If you have type 1 diabetes:  Check your blood glucose 4 or more times a day if you are on intensive insulin therapy with multiple daily injections (MDI) or if you are using an insulin pump. Check your blood glucose: ? Before every meal and snack. ? Before bedtime.  Also check your blood glucose: ? If you have symptoms of hypoglycemia. ? After treating low blood glucose. ? Before doing activities that create a risk for injury, like driving or using machinery. ? Before and after exercise. ? Two hours after a meal. ? Occasionally between 2:00 a.m. and 3:00 a.m., as directed.  You may need to check your blood glucose more often, 6-10 times per day, if: ? You have diabetes that is not well controlled. ? You are ill. ? You have a history of severe hypoglycemia. ? You have hypoglycemia unawareness. If you have type 2 diabetes:  Check your blood glucose 2 or more times a day if you take insulin or other diabetes medicines.  Check your blood glucose 4 or more times a day if you are on intensive insulin therapy. Occasionally, you may also need to check your glucose between 2:00 a.m. and 3:00 a.m., as directed.  Also check your blood glucose: ? Before and after exercise. ? Before doing activities that create a risk for injury, like driving or using machinery.  You may need to check your blood glucose more often if: ? Your medicine is being adjusted. ? Your diabetes is not well controlled. ? You are ill. General tips  Make sure you always have your supplies  with you.  After you use a few boxes of test strips, adjust (calibrate) your blood glucose meter by following instructions that came with your meter.  If you have questions or need help, all blood glucose meters have a 24-hour hotline phone number available that you can call. Also contact your health care provider with questions or concerns you may have. Where to find more information  The American Diabetes Association: www.diabetes.org  The Association of Diabetes Care & Education Specialists: www.diabeteseducator.org Contact a health care provider if:  Your blood glucose is at or above 240 mg/dL (13.3 mmol/L) for 2 days in a row.  You have been sick or have had a fever for 2 days or longer, and you are not getting better.  You have any of the following problems for more than 6 hours: ? You cannot eat or drink. ? You have nausea or vomiting. ? You have diarrhea. Get  help right away if:  Your blood glucose is lower than 54 mg/dL (3 mmol/L).  You become confused, or you have trouble thinking clearly.  You have difficulty breathing.  You have moderate or large ketone levels in your urine. These symptoms may represent a serious problem that is an emergency. Do not wait to see if the symptoms will go away. Get medical help right away. Call your local emergency services (911 in the [place] Do not drive yourself to the hospital. Summary  Monitoring your blood glucose is an important part of managing your diabetes.  Blood glucose monitoring involves checking your blood glucose as often as directed and keeping a log or record of your results over time.  Your health care provider will set individualized treatment goals for you. Your goals will be based on your age, other medical conditions you have, and how you respond to diabetes treatment.  Every time you check your blood glucose, write down your result. Also, write down any notes about things that may be affecting your blood glucose,  such as your diet and exercise for the day. This information is not intended to replace advice given to you by your health care provider. Make sure you discuss any questions you have with your health care provider. Document Revised: 03/14/2020 Document Reviewed: 03/14/2020 Elsevier Patient Education  2021 [employer].

## 2020-07-28 LAB — MICROALBUMIN / CREATININE URINE RATIO
Creatinine, Urine: 409.3 mg/dL
Microalb/Creat Ratio: 8 mg/g creat (ref 0–29)
Microalbumin, Urine: 32.2 ug/mL

## 2020-07-28 LAB — BMP8+ANION GAP
Anion Gap: 14 mmol/L (ref 10.0–18.0)
BUN/Creatinine Ratio: 11 — ABNORMAL LOW (ref 12–28)
BUN: 10 mg/dL (ref 8–27)
CO2: 24 mmol/L (ref 20–29)
Calcium: 10 mg/dL (ref 8.7–10.3)
Chloride: 102 mmol/L (ref 96–106)
Creatinine, Ser: 0.89 mg/dL (ref 0.57–1.00)
GFR calc Af Amer: 74 mL/min/{1.73_m2} (ref >59)
GFR calc non Af Amer: 64 mL/min/{1.73_m2} (ref >59)
Glucose: 248 mg/dL — ABNORMAL HIGH (ref 65–99)
Potassium: 4.2 mmol/L (ref 3.5–5.2)
Sodium: 140 mmol/L (ref 134–144)

## 2020-07-28 LAB — IRON AND TIBC
Iron Saturation: 21 % (ref 15–55)
Iron: 50 ug/dL (ref 27–139)
Total Iron Binding Capacity: 236 ug/dL — ABNORMAL LOW (ref 250–450)
UIBC: 186 ug/dL (ref 118–369)

## 2020-07-28 LAB — CBC
Hematocrit: 36.8 % (ref 34.0–46.6)
Hemoglobin: 11.9 g/dL (ref 11.1–15.9)
MCH: 25 pg — ABNORMAL LOW (ref 26.6–33.0)
MCHC: 32.3 g/dL (ref 31.5–35.7)
MCV: 77 fL — ABNORMAL LOW (ref 79–97)
Platelets: 542 10*3/uL — ABNORMAL HIGH (ref 150–450)
RBC: 4.76 x10E6/uL (ref 3.77–5.28)
RDW: 13.1 % (ref 11.7–15.4)
WBC: 7.8 10*3/uL (ref 3.4–10.8)

## 2020-07-28 LAB — FERRITIN: Ferritin: 329 ng/mL — ABNORMAL HIGH (ref 15–150)

## 2020-07-28 NOTE — Progress Notes (Addendum)
   CC: Fatigue  HPI:  [name] is a 75 y.o. 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 She presents today for follow up of above and to establish care. Please see problem-based assessment / plan for full details.   Past Medical History:  Diagnosis Date  . (HFpEF) heart failure with preserved ejection fraction ([place])   . COVID-19   . DM (diabetes mellitus) ([place])   . H/O: GI bleed   . HLD (hyperlipidemia)   . Ischemic cerebrovascular accident (CVA) [facility])    Social Hx: Lives at home with daughter who is helping take care of her post-stroke. Endorses occasional social alcohol use. Tried smoking in young adulthood although has never used tobacco heavily, no current use. Denies other illicit drug use.   Family Hx: Heavy family history of HLD.   Review of Systems:    Positive for: fatigue, decreased appetite, numbness and weakness throughout right leg, infrequent falls, light-headedness and dizziness, polyuria, polydipsia, blurry vision, bilateral leg pain, abdominal discomfort, rare dark stools.   Negative for: current nausea, vomiting, fevers, chills, new numbness, tingling or weakness, syncope, headaches.  10-point review of systems otherwise negative except as noted.  Physical Exam:  Vitals:   07/27/20 0935 07/27/20 1044 07/27/20 1045  BP: 90/74 103/68 125/76  Pulse: 93 82 88  Temp: 97.9 F (36.6 C)    TempSrc: Oral    SpO2: 100%    Weight: 161 lb 14.4 oz (73.4 kg)    Height: 5\' 3"  (1.6 m)     General: Patient appears tired but otherwise well. No acute distress. Eyes: Sclera non-icteric. No conjunctival injection.  HENT: Neck is  supple. MMM. No nasal discharge. Respiratory: Lungs are CTA, bilaterally. No wheezes, rales, or rhonchi. No tachypnea, breathing comfortably and saturating well on room air.  Cardiovascular: Regular rate and rhythm. No murmurs, rubs, or gallops. No lower extremity edema. Neurological: There is decreased sensation to light touch throughout entire RLE compared to the left. CN II-XII intact. No aphasia or dysarthria present. Alert and oriented.  Musculoskeletal: Strength throughout RLE is 4/5. Strength in all other extremities is 5/5 throughout. Slightly decreased muscle bulk with normal tone throughout.  Abdominal: Obese. Not distended. Soft and non-tender to palpation. Bowel sounds intact. No rebound or guarding. Skin: There are xanthelasmas of bilateral upper and lower eyelids. No other lesions. No rashes.  Psych: Normal affect. Normal tone of voice.   Assessment & Plan:   See Encounters Tab for problem based charting.  Patient discussed with Dr. Jimmye Norman.   Jeralyn Bennett, MD 07/29/2020, 11:20 AM Pager: [phone]

## 2020-07-29 ENCOUNTER — Encounter: Payer: Self-pay | Admitting: Student

## 2020-07-29 DIAGNOSIS — I5030 Unspecified diastolic (congestive) heart failure: Secondary | ICD-10-CM | POA: Insufficient documentation

## 2020-07-29 DIAGNOSIS — W19XXXA Unspecified fall, initial encounter: Secondary | ICD-10-CM | POA: Insufficient documentation

## 2020-07-29 DIAGNOSIS — Z8719 Personal history of other diseases of the digestive system: Secondary | ICD-10-CM

## 2020-07-29 DIAGNOSIS — U071 COVID-19: Secondary | ICD-10-CM | POA: Insufficient documentation

## 2020-07-29 DIAGNOSIS — R0989 Other specified symptoms and signs involving the circulatory and respiratory systems: Secondary | ICD-10-CM | POA: Insufficient documentation

## 2020-07-29 DIAGNOSIS — R718 Other abnormality of red blood cells: Secondary | ICD-10-CM | POA: Insufficient documentation

## 2020-07-29 HISTORY — DX: Personal history of other diseases of the digestive system: Z87.19

## 2020-07-29 NOTE — Assessment & Plan Note (Signed)
Patient found to have mild, chronic microcytosis, again found on repeat CBC today. She states she has been told she has a history of sickle cell trait disorder. Does endorse chronic joint pain that has been bothering her more recently in bilateral lower extremities as well as restlessness of LE's worse at night. Does note history of GI bleeding with hx sessile polyp removed last year, so consider possibility of iron deficiency contributing, although iron studies today most consistent with chronic disease.   - Consider repeat Hgb electrophoresis if unable to obtain records, although hx consistent with lab findings - Consider repeat iron studies further post-COVID / stroke to reassess - Consider GI referral if ongoing GI bleeding recurs / persists

## 2020-07-29 NOTE — Assessment & Plan Note (Signed)
Patient was admitted earlier this month for right-sided numbness and weakness, found to have acute vs. 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 CTA head showed moderate stenosis of the L PCA and aortic atherosclerosis, although no significant carotid stenosis and ECHO was negative for vegetations. Thought to be due to small vessel vascular disease in setting of uncontrolled risk factors. Continues to have numbness and weakness of the right leg, although significantly improved with home PT, OT, RN. She has fallen twice (see "Falls") although states she otherwise ambulates without trouble.   - Completed DAPT w/ Plavix  - Continue ASA 81mg  daily  - Continue home PT/OT/RN  - Continue to monitor symptoms while aggressively treating concurrent medical disease

## 2020-07-29 NOTE — Assessment & Plan Note (Signed)
Patient notes a history of GI bleeding and colonoscopy last year demonstrated relatively large sessile polyp that was removed. States she has had rare dark stools since that time although none recently. Does endorse chronic fatigue, orthostatic-type dizziness, and "inability to get comfortable" at night (although all worse since stroke).   Iron studies most consistent with anemia of chronic disease, although iron borderline and ferritin may be falsely elevated in the setting of recent COVID-19 infection.   - Consider repeat iron studies and GI referral vs. Colonoscopy in 2024 for follow up based on symptoms

## 2020-07-29 NOTE — Assessment & Plan Note (Addendum)
Patient has history of HLD with lipid profile this month showing cholesterol 294, TAG 164, HDL 33, LDL 228. She was started on Atorvastatin 40mg  daily during her admission and is tolerating this without myalgias.   - Will increase atorvastatin to 80mg  daily - Follow up lipid panel and CMP beginning by 09/2020 or sooner if symptomatic - Will likely require addition of Zetia and other medication to lower to goal LDL < 70

## 2020-07-29 NOTE — Assessment & Plan Note (Addendum)
Patient has history of NIDDM type II, currently "diet-controlled" not on any medications. She had previously been on PO medication in the distant past, although states she was taken off medication as her sugars normalized. However, Hgb A1c on admission was 10.2 with a goal < 7. She was started on short-acting Metformin, which I changed to XR Metformin; however, patient continued to experience intolerable N/V with this and stopped taking it. Daughter is helping care for her while she recovers from her stroke at home and bought her a glucometer to use at home; however, has not used this. Continues to have significant hyperglycemia in the office today. May be in the setting of recent acute illness and COVID-19 infection.  - Will start glipizide 66m daily (given cost concern with SGLT2-inhibitor or GLP-1 agonist) - Diabetic Educator met with patient during office visit today; provided glucometer and informed patient on its use  - Continue to monitor sugars fasting in the morning and several times throughout the day  - Instructed to bring glucometer and sugar logs to future appointments

## 2020-07-29 NOTE — Assessment & Plan Note (Signed)
ECHO from admission consistent with grade I diastolic dysfunction with hyperdynamic LV function with EF 70-75%. Also noted to have bifascicular block. Today, appears euvolemic, with RRR.  - Consider repeat ECG and cardiology referral next visit for further evaluation

## 2020-07-29 NOTE — Assessment & Plan Note (Signed)
Patient has highly variable blood pressure in the office. Initially 90/74 in left arm, although later found to be as high as 140/80 standing in left arm. Concurrent L and R arm pressures were 103/68 and 125/76, respectively. Endorses symptoms of orthostasis that have been ongoing, although worse since her stroke recently. Her fatigue may be due to her stroke with highly variable pressures. Not currently on any antihypertensives. Microalbumin:creatinine ratio normal.   - Continue to monitor closely without medication

## 2020-07-29 NOTE — Assessment & Plan Note (Signed)
Continues to endorse loss of appetite and fatigue could also be in setting of post-covid syndrome; however, eating and drinking okay despite this without SOB, cough, fevers.   - Continue to encourage PO intake

## 2020-07-30 ENCOUNTER — Other Ambulatory Visit: Payer: Self-pay

## 2020-07-30 DIAGNOSIS — E119 Type 2 diabetes mellitus without complications: Secondary | ICD-10-CM

## 2020-07-30 MED ORDER — ACCU-CHEK GUIDE VI STRP: ORAL_STRIP | 6 refills | Status: AC

## 2020-07-30 MED ORDER — ACCU-CHEK GUIDE ME W/DEVICE KIT: PACK | Status: AC

## 2020-07-30 NOTE — Telephone Encounter (Signed)
Returned call from [place] with [facility]-states pt was recently seen by [facility] and was given a glucometer during her visit.  States pt was instructed to test 3 times daily-needs order for test strips sent to pharmacy . Will add glucometer to medication list "no print" as one was provided and have ordering MD send in new rx for test strips.Regenia Skeeter, Dmitriy Gair Cass[place]1/31/20223:54 PM

## 2020-07-30 NOTE — Telephone Encounter (Signed)
Diana Elliott with [facility] requesting acc-chek guide test strips for pt. Please call back .

## 2020-07-31 ENCOUNTER — Encounter: Payer: Self-pay | Admitting: Internal Medicine

## 2020-07-31 NOTE — Progress Notes (Signed)
Internal Medicine Clinic Attending  Case discussed with Dr. Speakman  At the time of the visit.  We reviewed the resident's history and exam and pertinent patient test results.  I agree with the assessment, diagnosis, and plan of care documented in the resident's note.  

## 2020-08-01 NOTE — Progress Notes (Signed)
Internal Medicine Clinic Attending  Case discussed with Dr. Konrad Penta  At the time of the visit.  We reviewed the resident's history and exam and pertinent patient test results.  I agree with the assessment, diagnosis, and plan of care documented in the resident's note. Diana Elliott was found today to have asymmetric brachial BPs; she should be measured in and treated for the arm with the HIGHEST pressure.  Numerous chronic conditions identified which will need ongoing attention as we get to know this new patient.

## 2020-08-02 ENCOUNTER — Telehealth: Payer: Self-pay | Admitting: Student

## 2020-08-02 NOTE — Telephone Encounter (Signed)
[facility] PT calling to report seated Bp 130/80 Standing is 120/80 (pt reporting  intermittent dizziness).  Pt BS before eating 265 Pt reports Chronic right sided nerve pain 5 out of 10 since her stroke.  Pt would benefit from a 4 wheeled walker. DME provider of choice.  Pt would also benefit from additional visits 2 week 2 and  1 week 2

## 2020-08-02 NOTE — Telephone Encounter (Signed)
Returned call to [place], PT with [place]. No answer. Left detailed message on Jim's self-identified, confidential VM confirming request for [facility] PT 2 week 2 and 1 week 2. Also asked if he is requesting a rollator or rolling walker. Requested return call. Hubbard Hartshorn, BSN, RN-BC

## 2020-08-02 NOTE — Telephone Encounter (Signed)
Spoke with Ms. Alroy Dust regarding her lab results. Informed her her iron levels are okay for now although may be falsely elevated in the setting of recent COVID-19 infection. Persistent microcytosis most likely in setting of sickle cell trait that patient endorses. States she has been doing well taking glipizide and checks her sugars regularly, last 265.   - Will hold off on medication changes currently - Consider repeat Hgb Electrophoresis, especially in setting of joint pains at follow up visit  - Consider future iron studies post-COVID for monitoring and GI referral if IDA due to possible hx GI bleeding  - Reached out to front desk to schedule f/u appointment by 08/13/20  Please see my previous [facility] note for treatment suggestions.   Jeralyn Bennett, MD 08/02/2020, 6:45 PM Pager: [phone]

## 2020-08-03 NOTE — Telephone Encounter (Signed)
Clair Gulling returned call. Requesting [facility] PT 2 week 2 and 1 week 2 to work on functional mobility and fall risk prevention. Verbal auth given. Will route to Yellow Team for agreement/denial.  Also, requesting order for rollator at [facility]. Hubbard Hartshorn, BSN, RN-BC

## 2020-08-07 ENCOUNTER — Other Ambulatory Visit: Payer: Self-pay | Admitting: Internal Medicine

## 2020-08-07 DIAGNOSIS — I63532 Cerebral infarction due to unspecified occlusion or stenosis of left posterior cerebral artery: Secondary | ICD-10-CM

## 2020-08-07 NOTE — Telephone Encounter (Signed)
Agree with PT. Will place order for rolling walker

## 2020-08-13 NOTE — Telephone Encounter (Signed)
CM sent to [name] at [facility] for rollator per request from patient's [facility] PT. L. Randale Carvalho, BSN, RN-BC

## 2020-08-14 NOTE — Telephone Encounter (Signed)
Theophilus Bones, RN; Sandi Raveling, [place]; [place], Rise Paganini; [place], [place]; 1 other   received

## 2020-08-17 ENCOUNTER — Telehealth: Payer: Self-pay | Admitting: *Deleted

## 2020-08-17 ENCOUNTER — Encounter: Payer: Medicare Other | Admitting: Internal Medicine

## 2020-08-17 NOTE — Telephone Encounter (Addendum)
Call to patient about missed appointment for this morning.  Message to call the Clinics to reschedule if needed.             Jessikah Dicker, RN 08/17/2020 11:05 AM.

## 2020-08-29 ENCOUNTER — Telehealth: Payer: Self-pay

## 2020-08-29 NOTE — Telephone Encounter (Signed)
Okay, thank you!

## 2020-08-29 NOTE — Telephone Encounter (Signed)
Received TC from [place], [place] at [facility].  He was calling to report patient's BS was 234 today, states it usually runs in the 200's.  He states today was the last [facility] visit.  Per chart review, patient had a missed appt on 08/17/20.  NOV is 10/01/20 w/ Dr. Konrad Penta. TC to patient, spoke with her daughter, Alden Benjamin, earlier appt offered,  She declined and states they only want to see Dr. Konrad Penta.  RN informed daughter of rotation and other MD's are in clinic this month, she verbalized understanding, states she will call back if mom's sugar increases.  States she ate white rice last night.  States at this time she chooses not to r/s and she will keep appt on 10/01/20. SChaplin, RN,BSN

## 2020-10-01 ENCOUNTER — Ambulatory Visit (INDEPENDENT_AMBULATORY_CARE_PROVIDER_SITE_OTHER): Payer: Medicare Other | Admitting: Student

## 2020-10-01 ENCOUNTER — Encounter: Payer: Self-pay | Admitting: Student

## 2020-10-01 ENCOUNTER — Other Ambulatory Visit: Payer: Self-pay

## 2020-10-01 VITALS — BP 133/73 | HR 75 | Temp 98.3°F | Ht 63.0 in | Wt 161.7 lb

## 2020-10-01 DIAGNOSIS — E782 Mixed hyperlipidemia: Secondary | ICD-10-CM

## 2020-10-01 DIAGNOSIS — E1149 Type 2 diabetes mellitus with other diabetic neurological complication: Secondary | ICD-10-CM

## 2020-10-01 DIAGNOSIS — I10 Essential (primary) hypertension: Secondary | ICD-10-CM | POA: Insufficient documentation

## 2020-10-01 DIAGNOSIS — R0989 Other specified symptoms and signs involving the circulatory and respiratory systems: Secondary | ICD-10-CM | POA: Diagnosis not present

## 2020-10-01 DIAGNOSIS — Z8719 Personal history of other diseases of the digestive system: Secondary | ICD-10-CM

## 2020-10-01 DIAGNOSIS — E1159 Type 2 diabetes mellitus with other circulatory complications: Secondary | ICD-10-CM

## 2020-10-01 DIAGNOSIS — E119 Type 2 diabetes mellitus without complications: Secondary | ICD-10-CM | POA: Insufficient documentation

## 2020-10-01 DIAGNOSIS — J309 Allergic rhinitis, unspecified: Secondary | ICD-10-CM | POA: Insufficient documentation

## 2020-10-01 LAB — POCT GLYCOSYLATED HEMOGLOBIN (HGB A1C): Hemoglobin A1C: 7 % — AB (ref 4.0–5.6)

## 2020-10-01 LAB — GLUCOSE, CAPILLARY: Glucose-Capillary: 157 mg/dL — ABNORMAL HIGH (ref 70–99)

## 2020-10-01 MED ORDER — LOSARTAN POTASSIUM 25 MG PO TABS: 25.0000 mg | ORAL_TABLET | Freq: Every day | ORAL | 3 refills | Status: DC

## 2020-10-01 MED ORDER — FLUTICASONE PROPIONATE 50 MCG/ACT NA SUSP: 1.0000 | Freq: Every day | NASAL | 11 refills | Status: AC

## 2020-10-01 NOTE — Progress Notes (Signed)
   CC: Sinus Congestion   HPI:  [name] is a 75 y.o. lady who first presented to [facility] to establish care in January following an ischemic VL thalamic infarct w/ extension to the midbrain found at that time to have uncontrolled type II DM, severe HLD, HFpEF w/ G2DD and bifascicular heart block, presenting for a follow up visit. She says she has been feeling much better and has significantly changed her diet and lowered her consumption of simple sugars dramatically. She says her right arm and leg numbness and weakness continue to improve. She still has hypoesthesia of her right side although is now able to walk without her cane without any falls. She denies light-headedness, dizziness, polydipsia, polyuria, abdominal discomfort, any recurrence of dark or bloody stools. Her only complaints are of intermittent sneezing, post-nasal drip, and sinus congestion which she attributes to her allergies.   Past Medical History:  Diagnosis Date  . (HFpEF) heart failure with preserved ejection fraction ([place])   . COVID-19   . DM (diabetes mellitus) ([place])   . H/O: GI bleed   . HLD (hyperlipidemia)   . Ischemic cerebrovascular accident (CVA) ([place])    Review of Systems:  All others negative except as noted above in HPI.   Physical Exam:  Vitals:   10/01/20 1004 10/01/20 1013 10/01/20 1048  BP: (!) 86/66 (!) 145/70 133/73  Pulse: 82 82 75  Temp: 98.3 F (36.8 C)    TempSrc: Oral    SpO2: 98%    Weight: 161 lb 11.2 oz (73.3 kg)    Height: 5\' 3"  (1.6 m)     General: Patient appears tired but otherwise well in no acute distress.  Eyes: Sclera non-icteric. No conjunctival injection. Xanthelasmas are present on both upper and lower eyelids.  HENT: MMM. No nasal discharge. Respiratory: Lungs are CTA, bilaterally. No wheezes, rales, or rhonchi.  Cardiovascular: Regular rate and rhythm. No murmurs, rubs, or gallops. No lower extremity edema. Right radial pulse is 3+. Left radial pulse is not  palpable. All extremities feel warm and well-perfused. No audible bruits along left upper chest.  Abdominal: Soft and non-tender to palpation. Bowel sounds intact. No rebound or guarding. Neurological: CN II-XII are intact aside from hypoesthesia of the right side of her face. She has hypoesthesia and dysesthesia of her entire right side of her body. Strength is 4+/5 in all four extremities. She is alert and oriented x 3.  Musculoskeletal: ROMI in all four extremities.  Skin: No lesions. No rashes.  Psych: Normal affect. Normal tone of voice.   Assessment & Plan:   See Encounters Tab for problem based charting.  Patient discussed with Dr. Jimmye Norman.   Jeralyn Bennett, MD 10/01/2020, 1:10 PM Pager: [phone]

## 2020-10-01 NOTE — Progress Notes (Signed)
Internal Medicine Clinic Attending  Case discussed with Dr. Speakman  At the time of the visit.  We reviewed the resident's history and exam and pertinent patient test results.  I agree with the assessment, diagnosis, and plan of care documented in the resident's note.  

## 2020-10-01 NOTE — Assessment & Plan Note (Signed)
LDL was elevated to 228 following recent thalamic infarct 06/2020 with a goal of < 70.   - For now, continue atorvastatin 80mg  daily  - Will require repeat lipid profile at 3 month follow up visit  - Will likely require addition of Zetia at that time

## 2020-10-01 NOTE — Assessment & Plan Note (Addendum)
Patient denies any recurrence of dark or bloody stools. She did have a large sessile polyp on colonoscopy last year although is not due for repeat colonoscopy until 2024.   - Check CBC   Addendum: CBC is within normal limits. Microcytosis resolved. I do not have concern that she is having ongoing GI bleeding.

## 2020-10-01 NOTE — Assessment & Plan Note (Signed)
Patient endorses sneezing, intermittent post-nasal drip and sinus congestion as well as ear fullness. She attributes her symptoms to allergies although does not take anything at home for this.   - Start flonase 1-2 sprays each nare daily  - Recommend consideration of nasal lavage if symptoms persist

## 2020-10-01 NOTE — Assessment & Plan Note (Signed)
Hemoglobin A1c was elevated to 10.2 in January during admission for acute CVA. She previously had "diet-controlled" type II DM. She was started on Glipizide 5mg  daily 3 months ago and taught how to use a glucometer. Her fasting sugars have reportedly ranged from 156-190's. She has made significant changes in her diet with the help of her daughter, consuming minimal simple sugars. Polydipsia and polyuria have resolved and she denies any hypoglycemic episodes. Hgb A1c dropped to 7.0 today.   - Congratulated patient and daughter on their hard work!  - Continue glipizide 5mg  daily for now  - Encouraged her to call [facility] if she experiences hypoglycemic symptoms for consideration of dose reduction  - Continue to monitor fasting and pre-prandial sugars with goal <120

## 2020-10-01 NOTE — Assessment & Plan Note (Signed)
Blood pressure in left arm was 88/66. Blood pressure in right arm was initially 145/70 although improved to 133/73 on repeat measurement. She has had similarly unequal blood pressures in the past. She notes she has a history of pain in her left arm although denies swelling of her left arm or face. She does not have any bruits along left upper chest. Left radial pulse is not palpable. Exam findings are most consistent with aortic coarctation.   - Will check LUE duplex ultrasound  - If above is abnormal, will require vascular surgery follow up

## 2020-10-01 NOTE — Assessment & Plan Note (Signed)
Blood pressure was slightly elevated at 133/73 in right arm on repeat assessment.   - NOTE: Blood pressures should always be assessed in patient's right arm given hx unequal pressures - Will start Losartan 25mg  daily  - Continue close monitoring with goal < 120/80

## 2020-10-01 NOTE — Patient Instructions (Addendum)
Diana Elliott,   I am so glad that you're doing so well! Your A1c has dropped to within target range at 7.0. Keep up the fantastic work!  It would be helpful if you could bring your glucometer to your next appointment and try to check your sugars prior to eating as well as fasting in the morning.   Your blood pressure was slightly high today. Please start taking Losartan 1 tablet every morning. For your sinus troubles, please start using Flonase, 1-2 sprays each nare daily.   I have ordered a left arm ultrasound to check the vessels in that arm. You should receive a call to schedule this.   I will check some basic blood work today and call you with results. We will repeat your cholesterol levels at your next appointment.   Please call [phone] if you have any questions or concerns. Otherwise, please call to schedule an appointment in 3 months.   Thank you and take care!  Dr. Konrad Penta    Hypertension, Adult Hypertension is another name for high blood pressure. High blood pressure forces your heart to work harder to pump blood. This can cause problems over time. There are two numbers in a blood pressure reading. There is a top number (systolic) over a bottom number (diastolic). It is best to have a blood pressure that is below 120/80. Healthy choices can help lower your blood pressure, or you may need medicine to help lower it. What are the causes? The cause of this condition is not known. Some conditions may be related to high blood pressure. What increases the risk?  Smoking.  Having type 2 diabetes mellitus, high cholesterol, or both.  Not getting enough exercise or physical activity.  Being overweight.  Having too much fat, sugar, calories, or salt (sodium) in your diet.  Drinking too much alcohol.  Having long-term (chronic) kidney disease.  Having a family history of high blood pressure.  Age. Risk increases with age.  Race. You may be at higher risk if you are  African American.  Gender. Men are at higher risk than women before age 72. After age 19, women are at higher risk than men.  Having obstructive sleep apnea.  Stress. What are the signs or symptoms?  High blood pressure may not cause symptoms. Very high blood pressure (hypertensive crisis) may cause: ? Headache. ? Feelings of worry or nervousness (anxiety). ? Shortness of breath. ? Nosebleed. ? A feeling of being sick to your stomach (nausea). ? Throwing up (vomiting). ? Changes in how you see. ? Very bad chest pain. ? Seizures. How is this treated?  This condition is treated by making healthy lifestyle changes, such as: ? Eating healthy foods. ? Exercising more. ? Drinking less alcohol.  Your health care provider may prescribe medicine if lifestyle changes are not enough to get your blood pressure under control, and if: ? Your top number is above 130. ? Your bottom number is above 80.  Your personal target blood pressure may vary. Follow these instructions at home: Eating and drinking  If told, follow the DASH eating plan. To follow this plan: ? Fill one half of your plate at each meal with fruits and vegetables. ? Fill one fourth of your plate at each meal with whole grains. Whole grains include whole-wheat pasta, brown rice, and whole-grain bread. ? Eat or drink low-fat dairy products, such as skim milk or low-fat yogurt. ? Fill one fourth of your plate at each meal with low-fat (  lean) proteins. Low-fat proteins include fish, chicken without skin, eggs, beans, and tofu. ? Avoid fatty meat, cured and processed meat, or chicken with skin. ? Avoid pre-made or processed food.  Eat less than 1,500 mg of salt each day.  Do not drink alcohol if: ? Your doctor tells you not to drink. ? You are pregnant, may be pregnant, or are planning to become pregnant.  If you drink alcohol: ? Limit how much you use to:  0-1 drink a day for women.  0-2 drinks a day for men. ? Be  aware of how much alcohol is in your drink. In the [place] one drink equals one 12 oz bottle of beer (355 mL), one 5 oz glass of wine (148 mL), or one 1 oz glass of hard liquor (44 mL).   Lifestyle  Work with your doctor to stay at a healthy weight or to lose weight. Ask your doctor what the best weight is for you.  Get at least 30 minutes of exercise most days of the week. This may include walking, swimming, or biking.  Get at least 30 minutes of exercise that strengthens your muscles (resistance exercise) at least 3 days a week. This may include lifting weights or doing Pilates.  Do not use any products that contain nicotine or tobacco, such as cigarettes, e-cigarettes, and chewing tobacco. If you need help quitting, ask your doctor.  Check your blood pressure at home as told by your doctor.  Keep all follow-up visits as told by your doctor. This is important.   Medicines  Take over-the-counter and prescription medicines only as told by your doctor. Follow directions carefully.  Do not skip doses of blood pressure medicine. The medicine does not work as well if you skip doses. Skipping doses also puts you at risk for problems.  Ask your doctor about side effects or reactions to medicines that you should watch for. Contact a doctor if you:  Think you are having a reaction to the medicine you are taking.  Have headaches that keep coming back (recurring).  Feel dizzy.  Have swelling in your ankles.  Have trouble with your vision. Get help right away if you:  Get a very bad headache.  Start to feel mixed up (confused).  Feel weak or numb.  Feel faint.  Have very bad pain in your: ? Chest. ? Belly (abdomen).  Throw up more than once.  Have trouble breathing. Summary  Hypertension is another name for high blood pressure.  High blood pressure forces your heart to work harder to pump blood.  For most people, a normal blood pressure is less than 120/80.  Making  healthy choices can help lower blood pressure. If your blood pressure does not get lower with healthy choices, you may need to take medicine. This information is not intended to replace advice given to you by your health care provider. Make sure you discuss any questions you have with your health care provider. Document Revised: 02/24/2018 Document Reviewed: 02/24/2018 Elsevier Patient Education  2021 [employer].

## 2020-10-02 LAB — CBC
Hematocrit: 37.9 % (ref 34.0–46.6)
Hemoglobin: 12 g/dL (ref 11.1–15.9)
MCH: 25.2 pg — ABNORMAL LOW (ref 26.6–33.0)
MCHC: 31.7 g/dL (ref 31.5–35.7)
MCV: 80 fL (ref 79–97)
Platelets: 330 10*3/uL (ref 150–450)
RBC: 4.77 x10E6/uL (ref 3.77–5.28)
RDW: 15 % (ref 11.7–15.4)
WBC: 7.3 10*3/uL (ref 3.4–10.8)

## 2020-10-12 ENCOUNTER — Other Ambulatory Visit: Payer: Self-pay | Admitting: Student

## 2020-10-12 DIAGNOSIS — E785 Hyperlipidemia, unspecified: Secondary | ICD-10-CM

## 2020-10-12 DIAGNOSIS — E1159 Type 2 diabetes mellitus with other circulatory complications: Secondary | ICD-10-CM

## 2020-10-12 DIAGNOSIS — E119 Type 2 diabetes mellitus without complications: Secondary | ICD-10-CM

## 2021-01-11 ENCOUNTER — Other Ambulatory Visit: Payer: Self-pay

## 2021-01-11 DIAGNOSIS — E1159 Type 2 diabetes mellitus with other circulatory complications: Secondary | ICD-10-CM

## 2021-01-11 DIAGNOSIS — E785 Hyperlipidemia, unspecified: Secondary | ICD-10-CM

## 2021-01-11 MED ORDER — GLIPIZIDE 5 MG PO TABS: ORAL_TABLET | ORAL | 3 refills | Status: DC

## 2021-01-11 MED ORDER — ATORVASTATIN CALCIUM 80 MG PO TABS: ORAL_TABLET | ORAL | 2 refills | Status: DC

## 2021-04-10 ENCOUNTER — Other Ambulatory Visit: Payer: Self-pay

## 2021-04-10 DIAGNOSIS — E1159 Type 2 diabetes mellitus with other circulatory complications: Secondary | ICD-10-CM

## 2021-04-10 DIAGNOSIS — E785 Hyperlipidemia, unspecified: Secondary | ICD-10-CM

## 2021-04-10 MED ORDER — GLIPIZIDE 5 MG PO TABS: ORAL_TABLET | ORAL | 3 refills | Status: DC

## 2021-04-10 MED ORDER — ATORVASTATIN CALCIUM 80 MG PO TABS: ORAL_TABLET | ORAL | 2 refills | Status: DC

## 2021-04-16 DIAGNOSIS — Z1231 Encounter for screening mammogram for malignant neoplasm of breast: Secondary | ICD-10-CM | POA: Diagnosis not present

## 2021-06-03 ENCOUNTER — Other Ambulatory Visit: Payer: Self-pay | Admitting: Student

## 2021-06-03 DIAGNOSIS — E785 Hyperlipidemia, unspecified: Secondary | ICD-10-CM

## 2021-06-10 ENCOUNTER — Encounter: Payer: Medicare Other | Admitting: Internal Medicine

## 2021-06-30 ENCOUNTER — Other Ambulatory Visit: Payer: Self-pay | Admitting: Student

## 2021-06-30 DIAGNOSIS — E785 Hyperlipidemia, unspecified: Secondary | ICD-10-CM

## 2021-08-02 ENCOUNTER — Other Ambulatory Visit: Payer: Self-pay | Admitting: Student

## 2021-08-02 DIAGNOSIS — E785 Hyperlipidemia, unspecified: Secondary | ICD-10-CM

## 2021-08-30 ENCOUNTER — Other Ambulatory Visit: Payer: Self-pay | Admitting: Student

## 2021-08-30 DIAGNOSIS — E1159 Type 2 diabetes mellitus with other circulatory complications: Secondary | ICD-10-CM

## 2021-09-10 ENCOUNTER — Encounter: Payer: Self-pay | Admitting: Internal Medicine

## 2021-09-10 ENCOUNTER — Other Ambulatory Visit: Payer: Self-pay

## 2021-09-10 ENCOUNTER — Ambulatory Visit (INDEPENDENT_AMBULATORY_CARE_PROVIDER_SITE_OTHER): Payer: Medicare Other | Admitting: Internal Medicine

## 2021-09-10 VITALS — BP 145/83 | HR 75 | Temp 98.2°F | Ht 63.0 in | Wt 162.7 lb

## 2021-09-10 DIAGNOSIS — E1159 Type 2 diabetes mellitus with other circulatory complications: Secondary | ICD-10-CM

## 2021-09-10 DIAGNOSIS — R079 Chest pain, unspecified: Secondary | ICD-10-CM | POA: Diagnosis not present

## 2021-09-10 DIAGNOSIS — E782 Mixed hyperlipidemia: Secondary | ICD-10-CM | POA: Diagnosis not present

## 2021-09-10 DIAGNOSIS — R0989 Other specified symptoms and signs involving the circulatory and respiratory systems: Secondary | ICD-10-CM

## 2021-09-10 DIAGNOSIS — I10 Essential (primary) hypertension: Secondary | ICD-10-CM

## 2021-09-10 DIAGNOSIS — E1149 Type 2 diabetes mellitus with other diabetic neurological complication: Secondary | ICD-10-CM | POA: Diagnosis not present

## 2021-09-10 LAB — POCT GLYCOSYLATED HEMOGLOBIN (HGB A1C): Hemoglobin A1C: 6.5 % — AB (ref 4.0–5.6)

## 2021-09-10 LAB — GLUCOSE, CAPILLARY: Glucose-Capillary: 165 mg/dL — ABNORMAL HIGH (ref 70–99)

## 2021-09-10 MED ORDER — LOSARTAN POTASSIUM 25 MG PO TABS: 25.0000 mg | ORAL_TABLET | Freq: Every day | ORAL | 3 refills | Status: DC

## 2021-09-10 NOTE — Assessment & Plan Note (Addendum)
BP Readings from Last 3 Encounters:  ?09/10/21 (!) 145/83  ?10/01/20 133/73  ?07/27/20 125/76  ? ?Pt was last seen in the clinic 1 year ago, at which time she was started on losartan 25 mg daily with a BP goal of <120/80. ? ?The patient states that she ran out of losartan a few months ago and is requesting a refill today. ? ?See unequal blood pressures for more details. ? ?Plan: ?We will restart losartan 25 mg daily today with a blood pressure goal of less than 120/80. ?Note: Patient will need blood pressure checked on right arm due to history of unequal blood pressures. ?

## 2021-09-10 NOTE — Assessment & Plan Note (Signed)
The patient endorses a new history of exertional chest pain/shortness of breath.  She states that this started a few months ago and noticed it when she uses stairs.  It does not occur otherwise.  She describes it as throbbing.  It does not radiate anywhere. ? ?Plan: ?Given patient has risk factors including prior stroke, hypertension, hyperlipidemia, I have referred the patient to cardiology for consideration of a stress test.  Patient is amenable to this plan. ?

## 2021-09-10 NOTE — Assessment & Plan Note (Signed)
The patient is here today for routine follow-up.  Per review of records, she has tried metformin for diabetes but had significant GI side effects and was switched to glipizide 5 mg daily. ? ?The patient states she has been taking this daily.  She checks her sugars mainly in the mornings, and they range from the 120s to 180s typically.  The patient is extremely motivated to continue lifestyle changes with healthy diet and exercise to decrease her A1c and is asking whether she should continue taking glipizide. ? ?A1c of 6.5 today, decreased from 7 last year.  Fasting blood sugar of 165 today. ? ?Plan: ?A1c under goal today, will discontinue glipizide at this time.  Also favor discontinuing glipizide due to risk of hypoglycemic events.  We will continue implementing lifestyle changes and follow-up in 1 month.  Patient was encouraged to check her sugars regularly in the meantime.  I have also referred the patient to ophthalmology for diabetic eye exam. ?

## 2021-09-10 NOTE — Assessment & Plan Note (Addendum)
The patient's blood pressure was initially checked in the left arm, resulted in 90/66 today.  Blood pressure was rechecked on the right arm with the lowest value of 145/83.  Exam does not reveal any murmurs, however unable to palpate pulse on the left side when compared to the right. ? ?Plan: ?Ordered echo with Doppler for further work-up of aortic coarctation. ?

## 2021-09-10 NOTE — Patient Instructions (Addendum)
Thank you, [name] for allowing [place] to provide your care today. Today we discussed your blood pressures, diabetes, unequal blood pressures, cholesterol, and chest pain with exertion. ? ?1) We will stop your glipizide for now and see you in 1 month. Great job on implementing lifestyle changes! Please continue to check your blood sugars every morning. I have also referred you to the eye doctor (ophthalmology) for an eye exam given your history of diabetes. ? ?2) I have refilled your losartan. I have ordered an ultrasound of your heart to confirm the underlying cause of your unequal blood pressures in your arms. They will contact you to arrange this. ? ?3) For your chest pain at rest, I have referred you to the heart doctors who may consider a stress test. ? ?I have ordered the following labs for you: ? ?Lab Orders    ?     Lipid Profile    ?     Microalbumin / Creatinine Urine Ratio    ?     Glucose, capillary    ?     BMP8+Anion Gap    ?     POC Hbg A1C     ? ?Tests ordered today: ? ?Echo (ultrasound of the heart). ? ?Referrals ordered today:  ? ?Referral Orders    ?     Ambulatory referral to Cardiology    ?     Ambulatory referral to Ophthalmology     ?  ? ?I have ordered the following medication/changed the following medications:  ? ?Stop the following medications: ? ?Glipizide ? ? ?Start the following medications: ?Meds ordered this encounter  ?Medications  ? losartan (COZAAR) 25 MG tablet  ?  Sig: Take 1 tablet (25 mg total) by mouth daily.  ?  Dispense:  30 tablet  ?  Refill:  3  ?  ? ?Follow up:  1 month   ? ? ?Should you have any questions or concerns please call the internal medicine clinic at [phone].   ? ? ? ?

## 2021-09-10 NOTE — Assessment & Plan Note (Addendum)
The patient is on atorvastatin 80 mg daily. ? ?Plan: ?Lipid profile obtained today.  We will contact patient with results.  Continue atorvastatin.   ? ?Called to discuss lab results. No answer, left VM. ?

## 2021-09-10 NOTE — Progress Notes (Signed)
? ?  CC: routine follow-up ? ?HPI: ? ?Diana Elliott is a 76 y.o. with past medical history as noted below who presents to the clinic today for a routine follow-up. Please see problem-based list for further details, assessments, and plans.  ? ?Past Medical History:  ?Diagnosis Date  ? (HFpEF) heart failure with preserved ejection fraction ([place])   ? COVID-19   ? Diabetes mellitus type 2, diet-controlled ([place]) 11/19/2015  ? DM (diabetes mellitus) ([place])   ? H/O: GI bleed   ? HLD (hyperlipidemia)   ? Ischemic cerebrovascular accident (CVA) ([place])   ? ?Review of Systems: Negative aside from that listed in individualized problem based charting.  ? ?Physical Exam: ?Vitals:  ? 09/10/21 0919 09/10/21 0934 09/10/21 1002  ?BP: 90/66 (!) 164/87 (!) 145/83  ?Pulse: 84 84 75  ?Temp: 98.2 ?F (36.8 ?C)    ?TempSrc: Oral    ?SpO2: 100%    ?Weight: 162 lb 11.2 oz (73.8 kg)    ?Height: '5\' 3"'$  (1.6 m)    ? ?General: NAD, nl appearance ?HE: Normocephalic, atraumatic, EOMI, Conjunctivae normal ?ENT: No congestion, no rhinorrhea, no exudate or erythema  ?Cardiovascular: Normal rate, regular rhythm. No murmurs, rubs, or gallops.  Unable to palpate pulse in left arm; radial pulse in right arm is intact ?Pulmonary: Effort normal, breath sounds normal. No wheezes, rales, or rhonchi ?Abdominal: soft, nontender, bowel sounds present ?Musculoskeletal: no swelling, deformity, injury or tenderness in extremities ?Skin: Warm, dry, no bruising, erythema, or rash ?Psychiatric/Behavioral: normal mood, normal behavior    ? ? ?Assessment & Plan:  ? ?See Encounters Tab for problem based charting. ? ?Patient discussed with Dr.  Cain Sieve ? ?

## 2021-09-11 LAB — BMP8+ANION GAP
Anion Gap: 15 mmol/L (ref 10.0–18.0)
BUN/Creatinine Ratio: 17 (ref 12–28)
BUN: 16 mg/dL (ref 8–27)
CO2: 24 mmol/L (ref 20–29)
Calcium: 9.8 mg/dL (ref 8.7–10.3)
Chloride: 103 mmol/L (ref 96–106)
Creatinine, Ser: 0.93 mg/dL (ref 0.57–1.00)
Glucose: 174 mg/dL — ABNORMAL HIGH (ref 70–99)
Potassium: 4.1 mmol/L (ref 3.5–5.2)
Sodium: 142 mmol/L (ref 134–144)
eGFR: 64 mL/min/{1.73_m2} (ref >59)

## 2021-09-11 LAB — MICROALBUMIN / CREATININE URINE RATIO
Creatinine, Urine: 217.6 mg/dL
Microalb/Creat Ratio: 8 mg/g creat (ref 0–29)
Microalbumin, Urine: 16.5 ug/mL

## 2021-09-11 LAB — LIPID PANEL
Chol/HDL Ratio: 3.6 ratio (ref 0.0–4.4)
Cholesterol, Total: 200 mg/dL — ABNORMAL HIGH (ref 100–199)
HDL: 55 mg/dL (ref >39)
LDL Chol Calc (NIH): 125 mg/dL — ABNORMAL HIGH (ref 0–99)
Triglycerides: 113 mg/dL (ref 0–149)
VLDL Cholesterol Cal: 20 mg/dL (ref 5–40)

## 2021-09-11 NOTE — Progress Notes (Signed)
Internal Medicine Clinic Attending ? ?Case discussed with Dr. Lorin Glass  At the time of the visit.  We reviewed the resident?s history and exam and pertinent patient test results.  I agree with the assessment, diagnosis, and plan of care documented in the resident?s note.  ? ? ? ?Lots going on today. ?- Imaging to assess for aortic coarctation  ?- Cardiology referral for consideration of stress test for angina symptoms ?- A1C at goal of 6.5, and patient would prefer to not be on too many medicines. She was on glipizide monotherapy because metformin caused GI upset previously. Stop glipizide today. If she needs something else in the future, could do glipizide again, but maybe SGLT2 or GLP1 would be a better option for this 76yo ?

## 2021-09-16 ENCOUNTER — Telehealth: Payer: Self-pay | Admitting: Internal Medicine

## 2021-09-16 NOTE — Telephone Encounter (Signed)
Pt states she was rtn a phone in reference to her lab work on 09/10/2021.  Please call back. ?

## 2021-09-30 ENCOUNTER — Encounter: Payer: Medicare Other | Admitting: Internal Medicine

## 2021-10-02 ENCOUNTER — Encounter: Payer: Self-pay | Admitting: Internal Medicine

## 2021-12-13 IMAGING — CT CT ANGIO NECK
1 of 8 series · 13 of 46 positions shown, 18 images · IV contrast (OMNI)
Comparison: MRI and CT done yesterday.

CLINICAL DATA: Neurological deficit. Acute stroke suspected.
Slurred speech and right-sided numbness at presentation yesterday.
Left thalamic infarction.

EXAM:
CT ANGIOGRAPHY HEAD AND NECK
TECHNIQUE: Multidetector CT imaging of the head and neck was performed using
the standard protocol during bolus administration of intravenous
contrast. Multiplanar CT image reconstructions and MIPs were
obtained to evaluate the vascular anatomy. Carotid stenosis
measurements (when applicable) are obtained utilizing NASCET
criteria, using the distal internal carotid diameter as the
denominator.
CONTRAST:  75mL OMNIPAQUE IOHEXOL 350 MG/ML SOLN

[Series 13: thin · axial · 0.41mm/px · z∈[-284,-4]mm · 13 of 638 slices shown, 18 images]
[im 40/638  soft-tissue]
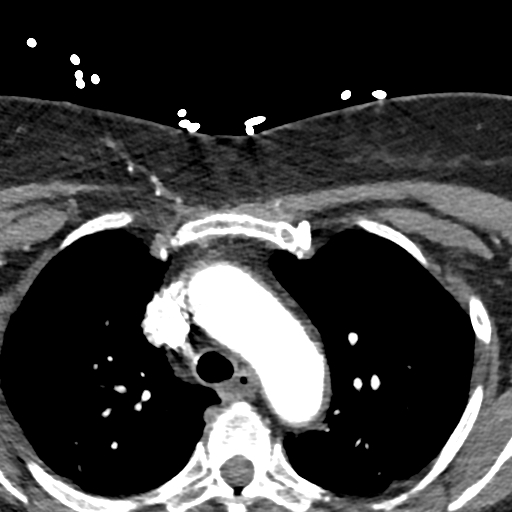
[im 40/638  bone]
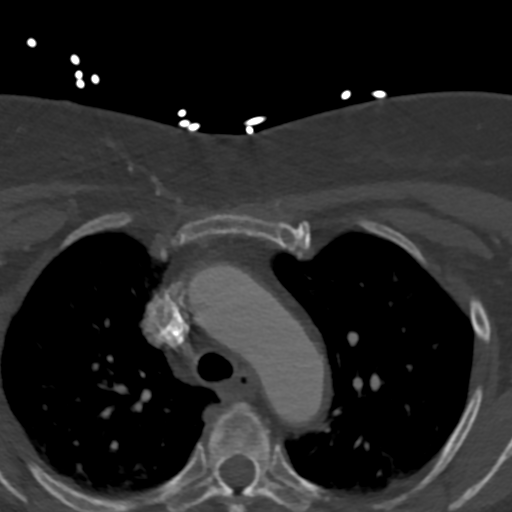
[im 80/638  soft-tissue]
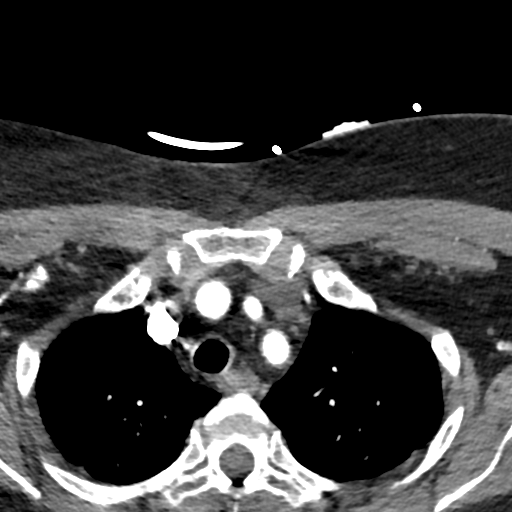
[im 160/638  soft-tissue]
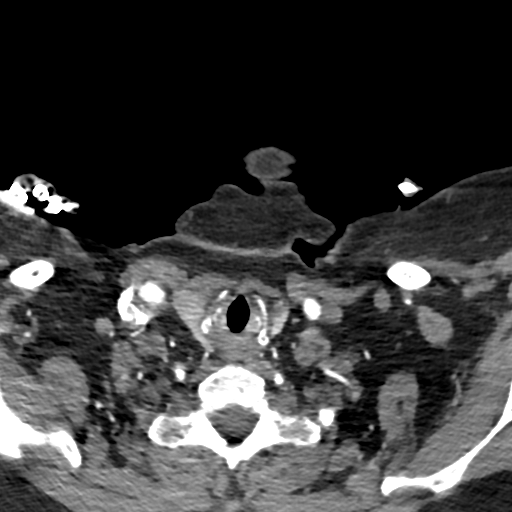
[im 200/638  soft-tissue]
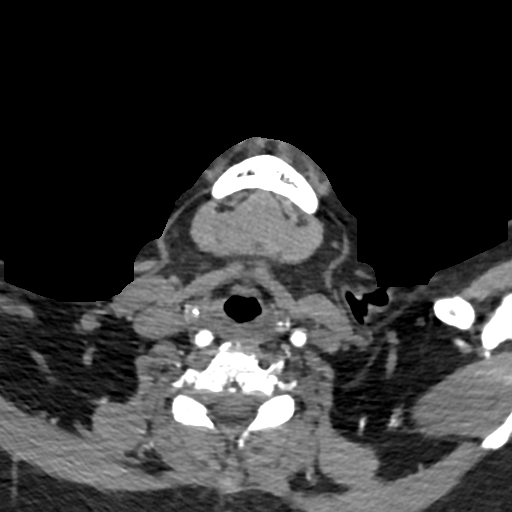
[im 239/638  soft-tissue]
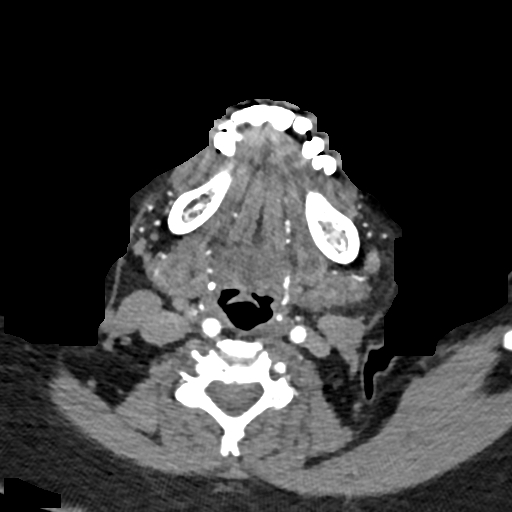
[im 279/638  soft-tissue]
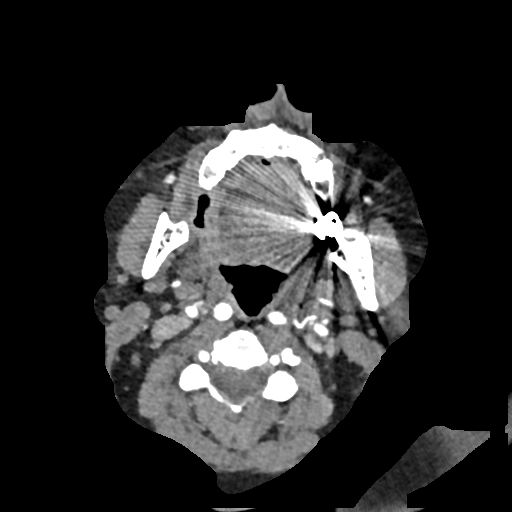
[im 359/638  soft-tissue]
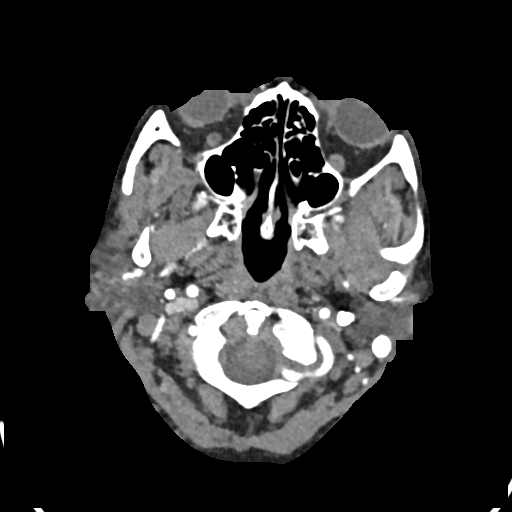
[im 399/638  soft-tissue]
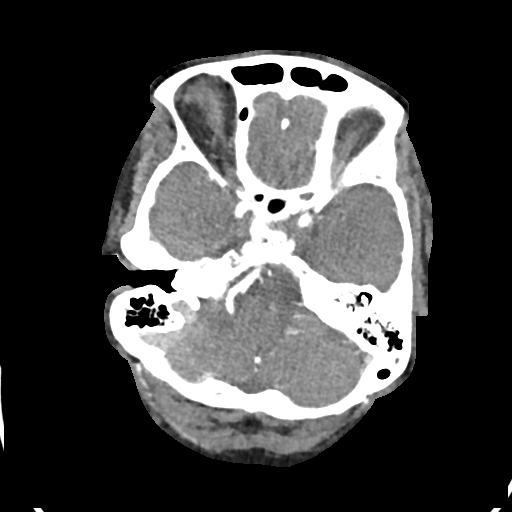
[im 438/638  soft-tissue]
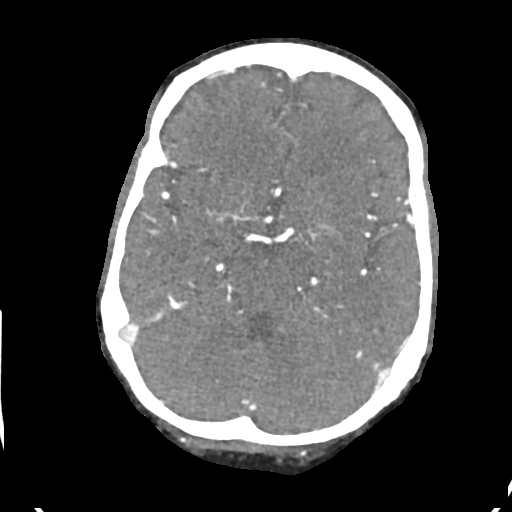
[im 438/638  bone]
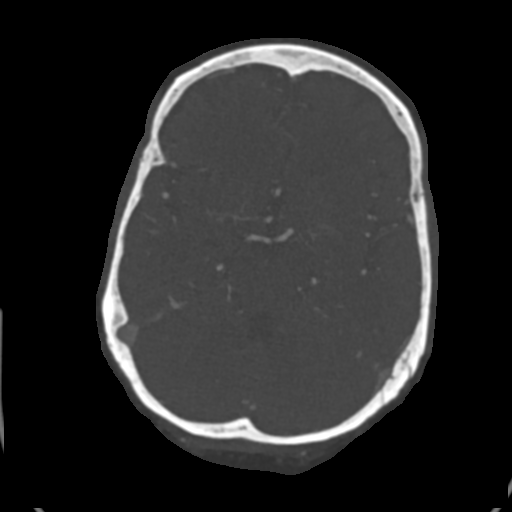
[im 478/638  soft-tissue]
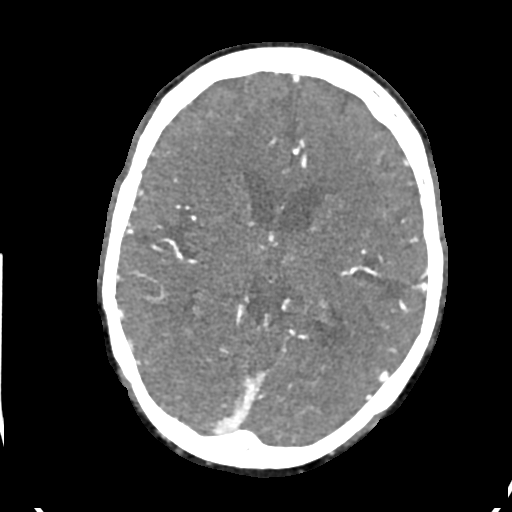
[im 478/638  lung]
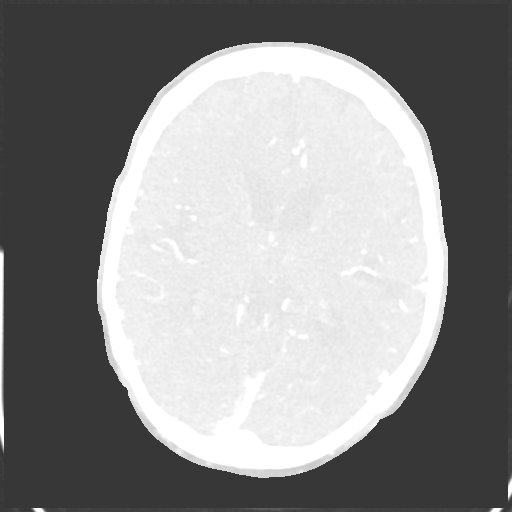
[im 518/638  lung]
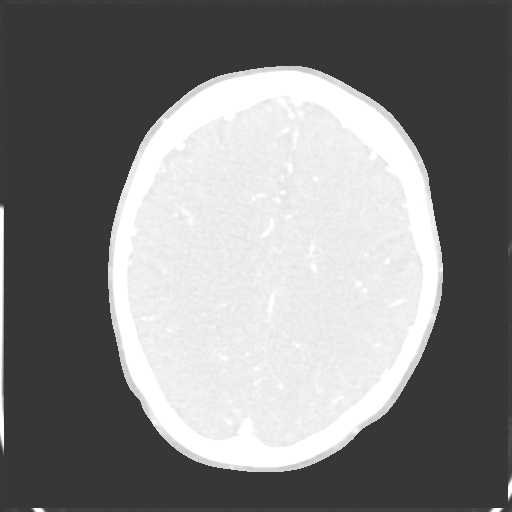
[im 558/638  soft-tissue]
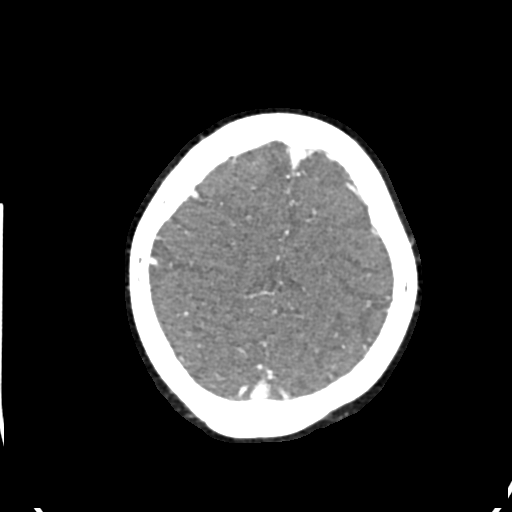
[im 558/638  lung]
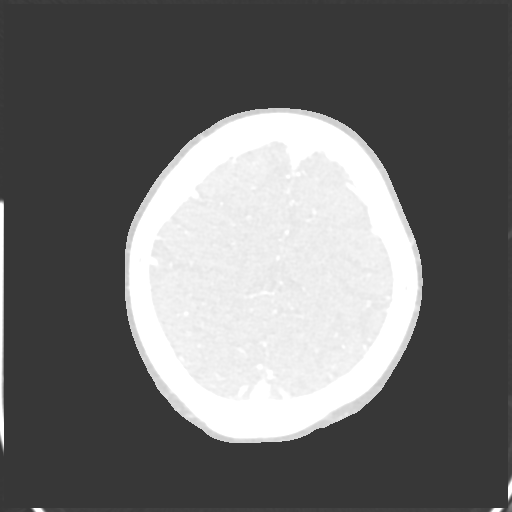
[im 598/638  soft-tissue]
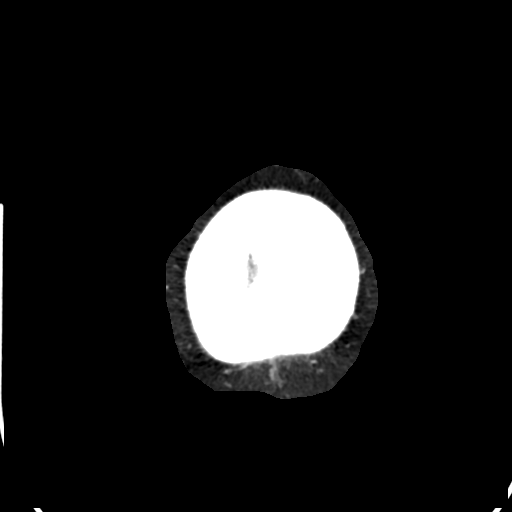
[im 598/638  lung]
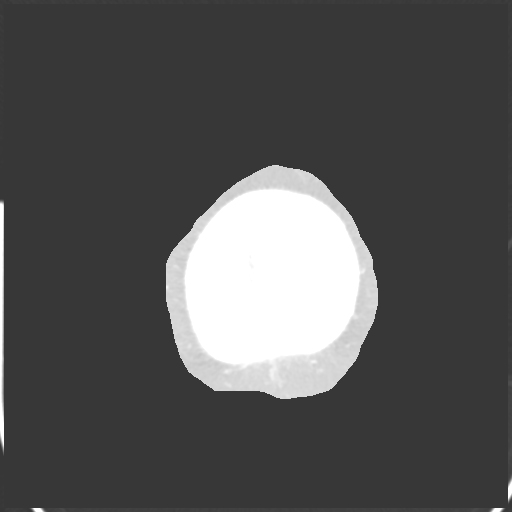

[13 of 46 positions shown; findings below may reference images not displayed]

FINDINGS: CTA NECK FINDINGS

Aortic arch: Aortic atherosclerotic calcification. Branching pattern
is normal without brachiocephalic vessel origin stenosis.

Right carotid system: Common carotid artery is tortuous but widely
patent to bifurcation. Carotid bifurcation is widely patent. No soft
or calcified plaque. Cervical ICA is normal.

Left carotid system: Common carotid artery widely patent to the
bifurcation. The vessel is tortuous. No carotid bifurcation stenosis
or irregularity. Cervical ICA is tortuous but widely patent.

Vertebral arteries: Both vertebral artery origins are widely patent.
Both vertebral arteries appear normal through the cervical region to
the foramen magnum.

Skeleton: Ordinary cervical spondylosis.

Other neck: No mass or lymphadenopathy.

Upper chest: Negative

Review of the MIP images confirms the above findings

CTA HEAD FINDINGS

Anterior circulation: Both internal carotid arteries widely patent
through the skull base and siphon regions. The anterior and middle
cerebral vessels are patent without proximal stenosis or branch
vessel occlusion. No aneurysm or vascular malformation.

Posterior circulation: Both vertebral arteries widely patent through
the foramen magnum. Dominant right PICA. Both vertebral arteries
supply the basilar. No basilar stenosis. Superior cerebellar and
posterior cerebral arteries are patent, but there is stenotic
disease within the left PCA of moderate severity.

Venous sinuses: Patent and normal.

Anatomic variants: None significant.

Review of the MIP images confirms the above findings
IMPRESSION: 1. No large or medium vessel occlusion.
2. No significant carotid bifurcation disease.
3. Moderate stenotic disease of the left PCA.
4. Aortic atherosclerosis.

Aortic Atherosclerosis ([id number]).

## 2022-01-31 ENCOUNTER — Telehealth: Payer: Self-pay

## 2022-01-31 NOTE — Patient Outreach (Signed)
   Care Management   Outreach Note  01/31/2022 Name: Diana Elliott MRN: [id number] DOB: 18-Feb-1946  Referred by: Farrel Gordon, DO Reason for referral : Care Coordination  An unsuccessful telephone outreach was attempted today. The patient was referred to the case management team for assistance with care management and care coordination.   Follow Up Plan: The care management team will reach out to the patient again over the next 30 days.   Johnney Killian, RN, BSN, CCM Care Management Coordinator [facility] Phone: [phone]: [phone]

## 2022-02-12 ENCOUNTER — Other Ambulatory Visit: Payer: Self-pay | Admitting: Internal Medicine

## 2022-02-12 DIAGNOSIS — I10 Essential (primary) hypertension: Secondary | ICD-10-CM

## 2022-02-27 ENCOUNTER — Other Ambulatory Visit: Payer: Self-pay | Admitting: Internal Medicine

## 2022-02-27 DIAGNOSIS — E785 Hyperlipidemia, unspecified: Secondary | ICD-10-CM

## 2022-02-27 MED ORDER — ATORVASTATIN CALCIUM 80 MG PO TABS: ORAL_TABLET | ORAL | 0 refills | Status: DC

## 2022-03-24 ENCOUNTER — Ambulatory Visit: Payer: Medicare Other

## 2022-03-24 ENCOUNTER — Ambulatory Visit (INDEPENDENT_AMBULATORY_CARE_PROVIDER_SITE_OTHER): Payer: Medicare Other | Admitting: Internal Medicine

## 2022-03-24 ENCOUNTER — Encounter: Payer: Self-pay | Admitting: Internal Medicine

## 2022-03-24 ENCOUNTER — Ambulatory Visit (HOSPITAL_COMMUNITY)
Admission: RE | Admit: 2022-03-24 | Discharge: 2022-03-24 | Disposition: A | Discharge status: Home / Self Care | Payer: Medicare Other | Source: Ambulatory Visit | Attending: Internal Medicine | Admitting: Internal Medicine

## 2022-03-24 ENCOUNTER — Other Ambulatory Visit: Payer: Self-pay

## 2022-03-24 VITALS — BP 149/73 | HR 67 | Temp 98.2°F | Ht 62.0 in | Wt 157.3 lb

## 2022-03-24 DIAGNOSIS — E1159 Type 2 diabetes mellitus with other circulatory complications: Secondary | ICD-10-CM | POA: Diagnosis not present

## 2022-03-24 DIAGNOSIS — I10 Essential (primary) hypertension: Secondary | ICD-10-CM | POA: Diagnosis not present

## 2022-03-24 DIAGNOSIS — R42 Dizziness and giddiness: Secondary | ICD-10-CM | POA: Insufficient documentation

## 2022-03-24 DIAGNOSIS — Z23 Encounter for immunization: Secondary | ICD-10-CM | POA: Diagnosis not present

## 2022-03-24 DIAGNOSIS — E1149 Type 2 diabetes mellitus with other diabetic neurological complication: Secondary | ICD-10-CM

## 2022-03-24 DIAGNOSIS — E782 Mixed hyperlipidemia: Secondary | ICD-10-CM | POA: Diagnosis not present

## 2022-03-24 LAB — GLUCOSE, CAPILLARY: Glucose-Capillary: 138 mg/dL — ABNORMAL HIGH (ref 70–99)

## 2022-03-24 LAB — POCT GLYCOSYLATED HEMOGLOBIN (HGB A1C): Hemoglobin A1C: 6.7 % — AB (ref 4.0–5.6)

## 2022-03-24 NOTE — Patient Instructions (Signed)
Ms. Riga,  It was a pleasure to care for you today. We discussed several topics:  Diabetes: Your HbA1c was 6.7%. This number is okay for you and I want you to stop taking the glipizide. High cholesterol: I have refilled your cholesterol medicine. Dizziness: Please make sure to schedule with cardiology when they call you to set up an appointment!   I would like you to return to clinic in about 6 weeks to check how your dizziness is doing.  My best, Dr. Marlou Sa

## 2022-03-24 NOTE — Progress Notes (Unsigned)
CC: dizziness  HPI:  Diana Elliott is a 76 y.o. person with past medical history as detailed below who presents today for regular follow-up and with the complaint of new dizziness. Please see problem based charting for detailed assessment and plan.  Past Medical History:  Diagnosis Date   (HFpEF) heart failure with preserved ejection fraction ([place])    COVID-19    Diabetes mellitus type 2, diet-controlled ([place]) 11/19/2015   DM (diabetes mellitus) ([place])    H/O: GI bleed    HLD (hyperlipidemia)    Ischemic cerebrovascular accident (CVA) ([place])    Review of Systems:  Negative unless otherwise stated.  Physical Exam:  Vitals:   03/24/22 1137 03/24/22 1212  BP: (!) 143/81 (!) 149/73  Pulse: 72 67  Temp: 98.2 F (36.8 C)   TempSrc: Oral   SpO2: 100%   Weight: 157 lb 4.8 oz (71.4 kg)   Height: '5\' 2"'$  (1.575 m)    Constitutional:Pleasant, resting comfortably in wheelchair. In no acute distress. Cardio:Regular rate and rhythm. No murmurs, rubs, or gallops. Pulm:Clear to auscultation bilaterally. Normal work of breathing on room air. KZL:[id number] for extremity edema. Skin:Warm and dry. Neuro: Mental Status: Patient is awake, alert, oriented x3. No signs of aphasia or neglect Cranial Nerves: III,IV, VI: EOMI without ptosis or diploplia.  V: Facial sensation is symmetric to light touch and temperature. VII: Facial movement is symmetric.  VIII: Hearing is intact to voice XI: Shoulder shrug is symmetric. XII: Tongue is midline without atrophy or fasciculations.  Motor: Normal effort thorughout, at least 5/5 bilateral UE, 5/5 bilateral LE Sensory: Sensation is grossly intact to bilateral UE & LE Cerebellar: Finger-Nose intact bilaterally Psych:Pleasant mood and affect.  Assessment & Plan:   See Encounters Tab for problem based charting.  Need for immunization against influenza Flu shot given today.  Dizziness Patient describes having dizzy spells over the last  several weeks that is not position-dependent. She has noticed these spells when she has been moving around and trying do to various tasks. When she has these episodes she notes palpitations and diaphoresis and feels "off-balance." She does not have room spinning and no increased weakness during these episodes.   During her last OV there was concern for possible coarctation of the aorta for which she was intended to have an echocardiogram obtained in addition to cardiology referral. This imaging was not completed and she declined the cardiology referral when contacted to set up an appointment.  Further, at that visit she was told to stop taking glipizide due to concerns about her age and hypoglycemia. She started herself back on this medication around 3 weeks to 1 month ago, just prior to the onset of these dizzy spells. Assessment: EKG obtained and is similar to prior with normal sinus rhythm with 1st degree AV block, left axis deviation, RBBB, and possible LA enlargement. Given the concern for possible coarctation of the aorta, there is a chance if she does in fact have this abnormality that it could be contributing to her symptoms. Further, she could be experiencing hypoglycemic episodes from restarting glipizide. Given lack of weakness or neurological changes I do not believe she is experiencing recurrent TIAs or new small strokes. Plan:Discontinue glipizide. Cardiology referral placed.   Hyperlipidemia Patient has been out of atorvastatin 80 mg daily for around 1 month due to issues with getting it refilled. Plan:Atorvastatin refilled. Check lipid panel at next OV.  Hypertension BP uncontrolled today, 149/73. Current regimen is losartan 25 mg daily. Plan:Given concern  for new dizziness with unknown etiology, will hold off on increasing anti-hypertensive medication to reduce risk of other contributing factors to this symptom such as hypotensive episodes. Will check BMP today. If BP remains  uncontrolled at next OV, consider increasing dose of losartan or adding a second agent.  DM II (diabetes mellitus, type II), controlled ([place]) Patient was asked to stop taking glipizide at her last OV but did restart this on her own about 3 weeks-1 month ago. She checks her fasting blood sugar each morning and it typically ranges 130s-180s. HbA1c stable today at 6.7% from 6.5% in March.  Assessment:Adequate control for this patient. See assessment and plan ander "dizziness"; concern for episodes of symptomatic hypoglycemia since restarting glipizide. Plan:Discontinue glipizide. Continue to check fasting blood sugar daily.  Patient discussed with Dr. Daryll Drown

## 2022-03-25 DIAGNOSIS — Z23 Encounter for immunization: Secondary | ICD-10-CM | POA: Insufficient documentation

## 2022-03-25 DIAGNOSIS — R42 Dizziness and giddiness: Secondary | ICD-10-CM | POA: Insufficient documentation

## 2022-03-25 NOTE — Assessment & Plan Note (Signed)
Patient describes having dizzy spells over the last several weeks that is not position-dependent. She has noticed these spells when she has been moving around and trying do to various tasks. When she has these episodes she notes palpitations and diaphoresis and feels "off-balance." She does not have room spinning and no increased weakness during these episodes.   During her last OV there was concern for possible coarctation of the aorta for which she was intended to have an echocardiogram obtained in addition to cardiology referral. This imaging was not completed and she declined the cardiology referral when contacted to set up an appointment.  Further, at that visit she was told to stop taking glipizide due to concerns about her age and hypoglycemia. She started herself back on this medication around 3 weeks to 1 month ago, just prior to the onset of these dizzy spells. Assessment: EKG obtained and is similar to prior with normal sinus rhythm with 1st degree AV block, left axis deviation, RBBB, and possible LA enlargement. Given the concern for possible coarctation of the aorta, there is a chance if she does in fact have this abnormality that it could be contributing to her symptoms. Further, she could be experiencing hypoglycemic episodes from restarting glipizide. Given lack of weakness or neurological changes I do not believe she is experiencing recurrent TIAs or new small strokes. Plan:Discontinue glipizide. Cardiology referral placed.

## 2022-03-25 NOTE — Assessment & Plan Note (Signed)
Patient was asked to stop taking glipizide at her last OV but did restart this on her own about 3 weeks-1 month ago. She checks her fasting blood sugar each morning and it typically ranges 130s-180s. HbA1c stable today at 6.7% from 6.5% in March.  Assessment:Adequate control for this patient. See assessment and plan ander "dizziness"; concern for episodes of symptomatic hypoglycemia since restarting glipizide. Plan:Discontinue glipizide. Continue to check fasting blood sugar daily.

## 2022-03-25 NOTE — Assessment & Plan Note (Signed)
Patient has been out of atorvastatin 80 mg daily for around 1 month due to issues with getting it refilled. Plan:Atorvastatin refilled. Check lipid panel at next OV.

## 2022-03-25 NOTE — Assessment & Plan Note (Signed)
BP uncontrolled today, 149/73. Current regimen is losartan 25 mg daily. Plan:Given concern for new dizziness with unknown etiology, will hold off on increasing anti-hypertensive medication to reduce risk of other contributing factors to this symptom such as hypotensive episodes. Will check BMP today. If BP remains uncontrolled at next OV, consider increasing dose of losartan or adding a second agent.

## 2022-03-25 NOTE — Assessment & Plan Note (Signed)
Flu shot given today

## 2022-03-26 LAB — BMP8+ANION GAP
Anion Gap: 14 mmol/L (ref 10.0–18.0)
BUN/Creatinine Ratio: 17 (ref 12–28)
BUN: 13 mg/dL (ref 8–27)
CO2: 22 mmol/L (ref 20–29)
Calcium: 9.3 mg/dL (ref 8.7–10.3)
Chloride: 104 mmol/L (ref 96–106)
Creatinine, Ser: 0.77 mg/dL (ref 0.57–1.00)
Glucose: 129 mg/dL — ABNORMAL HIGH (ref 70–99)
Potassium: 4 mmol/L (ref 3.5–5.2)
Sodium: 140 mmol/L (ref 134–144)
eGFR: 80 mL/min/{1.73_m2} (ref >59)

## 2022-03-27 NOTE — Progress Notes (Signed)
Internal Medicine Clinic Attending  Case discussed with Dr. Dean  at the time of the visit.  We reviewed the resident's history and exam and pertinent patient test results.  I agree with the assessment, diagnosis, and plan of care documented in the resident's note.  

## 2022-03-28 ENCOUNTER — Telehealth: Payer: Self-pay | Admitting: Internal Medicine

## 2022-03-28 NOTE — Telephone Encounter (Signed)
Attempted to contact patient to inform her of her lab results from recent [place]. No answer, and message left on VM to give the clinic a call back.  Farrel Gordon, DO

## 2022-03-31 ENCOUNTER — Encounter: Payer: Self-pay | Admitting: Internal Medicine

## 2022-03-31 ENCOUNTER — Telehealth: Payer: Self-pay | Admitting: Internal Medicine

## 2022-03-31 NOTE — Telephone Encounter (Signed)
Unable to reach patient in additional attempt to contact her regarding lab results from recent office.  Farrel Gordon, DO

## 2022-04-23 ENCOUNTER — Telehealth: Payer: Self-pay

## 2022-04-23 NOTE — Patient Outreach (Signed)
  Care Coordination   04/23/2022 Name: Diana Elliott MRN: [id number] DOB: 03/06/1946   Care Coordination Outreach Attempts:  A second unsuccessful outreach was attempted today to offer the patient with information about available care coordination services as a benefit of their health plan.     Follow Up Plan:  Additional outreach attempts will be made to offer the patient care coordination information and services.   Encounter Outcome:  No Answer  Care Coordination Interventions Activated:  No   Care Coordination Interventions:  No, not indicated    Jone Baseman, RN, MSN [facility] Care Management Care Management Coordinator Direct Line [phone]

## 2022-05-12 ENCOUNTER — Telehealth: Payer: Self-pay

## 2022-05-12 NOTE — Patient Outreach (Signed)
  Care Coordination   05/12/2022 Name: Nimco Bivens MRN: [id number] DOB: [birth year]   Care Coordination Outreach Attempts:  A third unsuccessful outreach was attempted today to offer the patient with information about available care coordination services as a benefit of their health plan.   Follow Up Plan:  No further outreach attempts will be made at this time. We have been unable to contact the patient to offer or enroll patient in care coordination services  Encounter Outcome:  No Answer  Care Coordination Interventions Activated:  No   Care Coordination Interventions:  No, not indicated    Jone Baseman, RN, MSN [facility] Care Management Coordinator Direct Line [phone]

## 2022-05-29 ENCOUNTER — Encounter: Payer: Self-pay | Admitting: Gastroenterology

## 2022-07-08 ENCOUNTER — Other Ambulatory Visit: Payer: Self-pay | Admitting: Internal Medicine

## 2022-07-08 DIAGNOSIS — I10 Essential (primary) hypertension: Secondary | ICD-10-CM

## 2022-08-01 DIAGNOSIS — M8580 Other specified disorders of bone density and structure, unspecified site: Secondary | ICD-10-CM | POA: Diagnosis not present

## 2022-08-01 DIAGNOSIS — Z1231 Encounter for screening mammogram for malignant neoplasm of breast: Secondary | ICD-10-CM | POA: Diagnosis not present

## 2022-08-05 ENCOUNTER — Other Ambulatory Visit: Payer: Self-pay | Admitting: Obstetrics & Gynecology

## 2022-08-05 DIAGNOSIS — Z780 Asymptomatic menopausal state: Secondary | ICD-10-CM

## 2022-09-22 ENCOUNTER — Other Ambulatory Visit: Payer: Self-pay

## 2022-09-22 DIAGNOSIS — E785 Hyperlipidemia, unspecified: Secondary | ICD-10-CM

## 2022-09-23 MED ORDER — ATORVASTATIN CALCIUM 80 MG PO TABS: ORAL_TABLET | ORAL | 0 refills | Status: DC

## 2022-09-25 ENCOUNTER — Ambulatory Visit (INDEPENDENT_AMBULATORY_CARE_PROVIDER_SITE_OTHER): Payer: Medicare Other

## 2022-09-25 ENCOUNTER — Ambulatory Visit: Payer: Medicare Other

## 2022-09-25 VITALS — BP 127/67 | HR 80 | Temp 98.3°F | Ht 62.0 in | Wt 157.5 lb

## 2022-09-25 DIAGNOSIS — E1159 Type 2 diabetes mellitus with other circulatory complications: Secondary | ICD-10-CM | POA: Diagnosis not present

## 2022-09-25 DIAGNOSIS — E1149 Type 2 diabetes mellitus with other diabetic neurological complication: Secondary | ICD-10-CM | POA: Diagnosis not present

## 2022-09-25 DIAGNOSIS — R42 Dizziness and giddiness: Secondary | ICD-10-CM | POA: Diagnosis not present

## 2022-09-25 DIAGNOSIS — E785 Hyperlipidemia, unspecified: Secondary | ICD-10-CM | POA: Diagnosis not present

## 2022-09-25 LAB — GLUCOSE, CAPILLARY: Glucose-Capillary: 180 mg/dL — ABNORMAL HIGH (ref 70–99)

## 2022-09-25 LAB — POCT GLYCOSYLATED HEMOGLOBIN (HGB A1C): Hemoglobin A1C: 7.2 % — AB (ref 4.0–5.6)

## 2022-09-25 MED ORDER — ATORVASTATIN CALCIUM 80 MG PO TABS: ORAL_TABLET | ORAL | 0 refills | Status: DC

## 2022-09-25 NOTE — Progress Notes (Signed)
   CC: A1c check  HPI:  Diana Elliott is a 77 y.o. with past medical history as below who presents for follow-up of her diabetes.  See detailed assessment and plan for HPI.  Past Medical History:  Diagnosis Date   (HFpEF) heart failure with preserved ejection fraction ([place])    COVID-19    Diabetes mellitus type 2, diet-controlled ([place]) 11/19/2015   DM (diabetes mellitus) ([place])    H/O: GI bleed    HLD (hyperlipidemia)    Ischemic cerebrovascular accident (CVA) ([place])    Review of Systems: See detailed assessment and plan for pertinent ROS.  Physical Exam:  Vitals:   09/25/22 1526  BP: 127/67  Pulse: 80  Temp: 98.3 F (36.8 C)  TempSrc: Oral  SpO2: 99%  Weight: 157 lb 8 oz (71.4 kg)  Height: 5\' 2"  (1.575 m)   Physical Exam Constitutional:      General: She is not in acute distress. HENT:     Head: Normocephalic and atraumatic.  Eyes:     Extraocular Movements: Extraocular movements intact.  Cardiovascular:     Rate and Rhythm: Normal rate and regular rhythm.     Heart sounds: No murmur heard. Pulmonary:     Effort: Pulmonary effort is normal.     Breath sounds: No wheezing, rhonchi or rales.  Musculoskeletal:     Right lower leg: No edema.     Left lower leg: No edema.  Skin:    General: Skin is warm and dry.  Neurological:     General: No focal deficit present.     Mental Status: She is alert and oriented to person, place, and time.  Psychiatric:        Mood and Affect: Mood normal.        Behavior: Behavior normal.      Assessment & Plan:   See Encounters Tab for problem based charting.  DM II (diabetes mellitus, type II), controlled [facility]) Patient presents today for follow-up of her type 2 diabetes.  A1c 7.2., up from 6.7 six months ago.  Glipizide discontinued last year given concerns for hypoglycemic episodes. Continues to endorse dizziness despite being off diabetes medications. Given age, goal A1c 7.5.  -continue with diet and exercise -f/u in  3 months  Hyperlipidemia Patient takes atorvastatin 80 mg daily.  -refill atorvastatin 80 mg daily -repeat lipid panel  Dizziness Patient continues with dizziness but no syncope. Denies palpitations. Notable for pressure differential in upper extremities prompting concerns from prior providers for coarctation, which could cause dizziness depending on location. I do not appreciate murmur on exam. Patient could also be experiencing orthostatic hypotension. Appears dehydrated on exam and symptoms worsening upon standing in clinic today. Denies vertiginous symptoms. Unlikely due to hypoglycemia now that off diabetes medication. -referral to cardiology -referral for echo -encouraged hydration    Patient discussed with Dr. Daryll Drown

## 2022-09-25 NOTE — Assessment & Plan Note (Signed)
Patient takes atorvastatin 80 mg daily.  -refill atorvastatin 80 mg daily -repeat lipid panel

## 2022-09-25 NOTE — Assessment & Plan Note (Signed)
Patient continues with dizziness but no syncope. Denies palpitations. Notable for pressure differential in upper extremities prompting concerns from prior providers for coarctation, which could cause dizziness depending on location. I do not appreciate murmur on exam. Patient could also be experiencing orthostatic hypotension. Appears dehydrated on exam and symptoms worsening upon standing in clinic today. Denies vertiginous symptoms. Unlikely due to hypoglycemia now that off diabetes medication. -referral to cardiology -referral for echo -encouraged hydration

## 2022-09-25 NOTE — Patient Instructions (Addendum)
Ms.Virgia Spallone, it was a pleasure seeing you today!  Today we discussed: Dizziness - will place another referral to cardiology and for echo Diabetes - continue off medicine and return in 3 months for A1c check   I have ordered the following labs today:  Lab Orders         Glucose, capillary         Lipid Profile         POC Hbg A1C      Tests ordered today:  A1c  Referrals ordered today:   Referral Orders         Ambulatory referral to Cardiology       I have ordered the following medication/changed the following medications:   Stop the following medications: Medications Discontinued During This Encounter  Medication Reason   atorvastatin (LIPITOR) 80 MG tablet Reorder     Start the following medications: Meds ordered this encounter  Medications   atorvastatin (LIPITOR) 80 MG tablet    Sig: TAKE 1 TABLET(80 MG) BY MOUTH DAILY    Dispense:  90 tablet    Refill:  0    **Patient requests 90 days supply**     Follow-up: 3 months   Please make sure to arrive 15 minutes prior to your next appointment. If you arrive late, you may be asked to reschedule.   We look forward to seeing you next time. Please call our clinic at [phone] if you have any questions or concerns. The best time to call is Monday-Friday from 9am-4pm, but there is someone available 24/7. If after hours or the weekend, call the main hospital number and ask for the Internal Medicine Resident On-Call. If you need medication refills, please notify your pharmacy one week in advance and they will send [place] a request.  Thank you for letting us take part in your care. Wishing you the best!  Thank you, Linward Natal, MD

## 2022-09-25 NOTE — Assessment & Plan Note (Addendum)
Patient presents today for follow-up of her type 2 diabetes.  A1c 7.2., up from 6.7 six months ago.  Glipizide discontinued last year given concerns for hypoglycemic episodes. Continues to endorse dizziness despite being off diabetes medications. Given age, goal A1c 7.5.  -continue with diet and exercise -f/u in 3 months

## 2022-09-26 LAB — LIPID PANEL
Chol/HDL Ratio: 6.9 ratio — ABNORMAL HIGH (ref 0.0–4.4)
Cholesterol, Total: 325 mg/dL — ABNORMAL HIGH (ref 100–199)
HDL: 47 mg/dL (ref >39)
LDL Chol Calc (NIH): 241 mg/dL — ABNORMAL HIGH (ref 0–99)
Triglycerides: 189 mg/dL — ABNORMAL HIGH (ref 0–149)
VLDL Cholesterol Cal: 37 mg/dL (ref 5–40)

## 2022-10-02 NOTE — Progress Notes (Signed)
Internal Medicine Clinic Attending  Case discussed with Dr. White  at the time of the visit.  We reviewed the resident's history and exam and pertinent patient test results.  I agree with the assessment, diagnosis, and plan of care documented in the resident's note.  

## 2022-10-21 ENCOUNTER — Ambulatory Visit (HOSPITAL_COMMUNITY): Admission: RE | Admit: 2022-10-21 | Payer: Medicare Other | Source: Ambulatory Visit

## 2022-11-12 ENCOUNTER — Ambulatory Visit: Payer: Medicare Other | Attending: Cardiology | Admitting: Cardiology

## 2022-11-12 ENCOUNTER — Encounter: Payer: Self-pay | Admitting: Cardiology

## 2022-11-12 VITALS — BP 102/86 | HR 82 | Ht 62.0 in | Wt 156.0 lb

## 2022-11-12 DIAGNOSIS — I10 Essential (primary) hypertension: Secondary | ICD-10-CM | POA: Diagnosis not present

## 2022-11-12 DIAGNOSIS — R0989 Other specified symptoms and signs involving the circulatory and respiratory systems: Secondary | ICD-10-CM | POA: Diagnosis not present

## 2022-11-12 NOTE — Patient Instructions (Signed)
Medication Instructions:  The current medical regimen is effective;  continue present plan and medications.  *If you need a refill on your cardiac medications before your next appointment, please call your pharmacy*  Testing/Procedures: Your physician has requested that you have a carotid duplex. This test is an ultrasound of the carotid arteries in your neck. It looks at blood flow through these arteries that supply the brain with blood. Allow one hour for this exam. There are no restrictions or special instructions.  Follow-Up: At [facility], you and your health needs are our priority.  As part of our continuing mission to provide you with exceptional heart care, we have created designated Provider Care Teams.  These Care Teams include your primary Cardiologist (physician) and Advanced Practice Providers (APPs -  Physician Assistants and Nurse Practitioners) who all work together to provide you with the care you need, when you need it.  We recommend signing up for the patient portal called "MyChart".  Sign up information is provided on this After Visit Summary.  MyChart is used to connect with patients for Virtual Visits (Telemedicine).  Patients are able to view lab/test results, encounter notes, upcoming appointments, etc.  Non-urgent messages can be sent to your provider as well.   To learn more about what you can do with MyChart, go to [email or web address].    Your next appointment:   6 month(s)  Provider:   Jari Favre, PA-C, Robin Searing, NP, Jacolyn Reedy, PA-C, Eligha Bridegroom, NP, or Tereso Newcomer, PA-C

## 2022-11-12 NOTE — Progress Notes (Signed)
Cardiology Office Note:    Date:  11/12/2022   ID:  Diana Elliott, DOB June 24, 1946, MRN [id number]  PCP:  Champ Mungo, DO    HeartCare Providers Cardiologist:  None     Referring MD: Champ Mungo, DO    History of Present Illness:    Diana Elliott is a 77 y.o. female here for the evaluation of dizziness with diabetes and hyperlipidemia at the request of Dr. August Saucer  She has had dizziness but denies syncope.  She did have blood pressure differential noted in the upper extremities.  No murmur on exam.  Echo in 2022 was unremarkable.  No chest pain.  She is feeling better.  No further dizziness.  She is helping to take care of her great grandchild.  Mild residual right-sided stroke symptoms.  Past Medical History:  Diagnosis Date   (HFpEF) heart failure with preserved ejection fraction ([facility])    COVID-19    Diabetes mellitus type 2, diet-controlled ([facility]) 11/19/2015   DM (diabetes mellitus) ([facility])    H/O: GI bleed    HLD (hyperlipidemia)    Ischemic cerebrovascular accident (CVA) ([facility])     Past Surgical History:  Procedure Laterality Date   TUBAL LIGATION      Current Medications: Current Meds  Medication Sig   AMBULATORY NON FORMULARY MEDICATION Medication Name: Nitroglycerin ointment 0.125% use pea sized amount 4 times a day per rectum  I   aspirin EC 81 MG EC tablet Take 1 tablet (81 mg total) by mouth daily. Swallow whole.   atorvastatin (LIPITOR) 80 MG tablet TAKE 1 TABLET(80 MG) BY MOUTH DAILY   b complex vitamins tablet Take 1 tablet by mouth daily.   Blood Glucose Monitoring Suppl (ACCU-CHEK GUIDE ME) w/Device KIT Use to test blood glucose 3 times daily.   Cholecalciferol (VITAMIN D PO) Take 1 tablet by mouth daily.   glucose blood (ACCU-CHEK GUIDE) test strip Use to test blood glucose 3 times daily.  Dx: E11.9   losartan (COZAAR) 25 MG tablet TAKE 1 TABLET(25 MG) BY MOUTH DAILY   Omega-3 Fatty Acids (OMEGA 3 PO) Take 1 capsule by mouth daily.      Allergies:   Patient has no known allergies.   Social History   Socioeconomic History   Marital status: Widowed    Spouse name: Not on file   Number of children: Not on file   Years of education: Not on file   Highest education level: Not on file  Occupational History   Not on file  Tobacco Use   Smoking status: Never   Smokeless tobacco: Never  Vaping Use   Vaping Use: Never used  Substance and Sexual Activity   Alcohol use: No    Alcohol/week: 0.0 standard drinks of alcohol   Drug use: No   Sexual activity: Not on file  Other Topics Concern   Not on file  Social History Narrative   Not on file   Social Determinants of Health   Financial Resource Strain: Not on file  Food Insecurity: Not on file  Transportation Needs: Not on file  Physical Activity: Not on file  Stress: Not on file  Social Connections: Not on file     Family History: The patient's family history is negative for Colon cancer, Esophageal cancer, Rectal cancer, and Stomach cancer.  ROS:   Please see the history of present illness.     All other systems reviewed and are negative.  EKGs/Labs/Other Studies Reviewed:    The following studies  were reviewed today: Cardiac Studies & Procedures       ECHOCARDIOGRAM  ECHOCARDIOGRAM LIMITED 07/05/2020  Narrative ECHOCARDIOGRAM LIMITED REPORT    Patient Name:   Diana Elliott Date of Exam: 07/05/2020 Medical Rec #:  [id number]       Height:       62.0 in Accession #:    [id number]      Weight:       180.0 lb Date of Birth:  01/21/1946       BSA:          1.828 m Patient Age:    74 years        BP:           115/85 mmHg Patient Gender: F               HR:           78 bpm. Exam Location:  Inpatient  Procedure: Limited Echo, Cardiac Doppler and Limited Color Doppler  Indications:    Stroke. 434.91 / I163.9  History:        Patient has no prior history of Echocardiogram examinations. COVID-19 Positive.  Sonographer:    Tiffany  Dance Referring Phys: [phone] JULIE HAVILAND  IMPRESSIONS   1. Left ventricular ejection fraction, by estimation, is 70 to 75%. The left ventricle has hyperdynamic function. The left ventricle has no regional wall motion abnormalities. There is moderate concentric left ventricular hypertrophy. Left ventricular diastolic parameters are consistent with Grade I diastolic dysfunction (impaired relaxation). 2. Right ventricular systolic function is normal. The right ventricular size is normal. There is normal pulmonary artery systolic pressure. 3. The mitral valve is normal in structure. No evidence of mitral valve regurgitation. No evidence of mitral stenosis. 4. The aortic valve is normal in structure. Aortic valve regurgitation is not visualized. No aortic stenosis is present. 5. The inferior vena cava is normal in size with greater than 50% respiratory variability, suggesting right atrial pressure of 3 mmHg.  Comparison(s): No prior Echocardiogram.  Conclusion(s)/Recommendation(s): No intracardiac source of embolism detected on this transthoracic study. A transesophageal echocardiogram is recommended to exclude cardiac source of embolism if clinically indicated.  FINDINGS Left Ventricle: Left ventricular ejection fraction, by estimation, is 70 to 75%. The left ventricle has hyperdynamic function. The left ventricle has no regional wall motion abnormalities. The left ventricular internal cavity size was normal in size. There is moderate concentric left ventricular hypertrophy. Left ventricular diastolic parameters are consistent with Grade I diastolic dysfunction (impaired relaxation).  Right Ventricle: The right ventricular size is normal. No increase in right ventricular wall thickness. Right ventricular systolic function is normal. There is normal pulmonary artery systolic pressure.  Left Atrium: Left atrial size was normal in size.  Right Atrium: Right atrial size was normal in  size.  Pericardium: There is no evidence of pericardial effusion.  Mitral Valve: The mitral valve is normal in structure. No evidence of mitral valve stenosis.  Tricuspid Valve: The tricuspid valve is normal in structure. Tricuspid valve regurgitation is mild . No evidence of tricuspid stenosis.  Aortic Valve: The aortic valve is normal in structure. Aortic valve regurgitation is not visualized. No aortic stenosis is present.  Pulmonic Valve: The pulmonic valve was normal in structure. Pulmonic valve regurgitation is not visualized. No evidence of pulmonic stenosis.  Aorta: The aortic root is normal in size and structure.  Venous: The inferior vena cava is normal in size with greater than 50% respiratory variability, suggesting right atrial pressure  of 3 mmHg.  IAS/Shunts: No atrial level shunt detected by color flow Doppler.  LEFT VENTRICLE PLAX 2D LVIDd:         3.10 cm LVIDs:         2.10 cm LV PW:         1.10 cm LV IVS:        1.30 cm LVOT diam:     1.90 cm LV SV:         84 LV SV Index:   46 LVOT Area:     2.84 cm   RIGHT VENTRICLE          IVC RV Basal diam:  2.30 cm  IVC diam: 1.30 cm  LEFT ATRIUM             Index       RIGHT ATRIUM           Index LA diam:        2.70 cm 1.48 cm/m  RA Area:     10.10 cm LA Vol (A2C):   29.5 ml 16.14 ml/m RA Volume:   20.90 ml  11.43 ml/m LA Vol (A4C):   40.6 ml 22.21 ml/m LA Biplane Vol: 37.3 ml 20.41 ml/m AORTIC VALVE LVOT Vmax:   129.00 cm/s LVOT Vmean:  91.300 cm/s LVOT VTI:    0.298 m  AORTA Ao Root diam: 3.00 cm Ao Asc diam:  3.50 cm  MITRAL VALVE MV Area (PHT): 2.69 cm    SHUNTS MV Decel Time: 282 msec    Systemic VTI:  0.30 m MV E velocity: 70.30 cm/s  Systemic Diam: 1.90 cm MV A velocity: 77.90 cm/s MV E/A ratio:  0.90  Tobias Alexander MD Electronically signed by Tobias Alexander MD Signature Date/Time: 07/05/2020/2:04:48 PM    Final            ? 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  Recent Labs: 03/24/2022: BUN 13; Creatinine, Ser 0.77; Potassium 4.0; Sodium 140  Recent Lipid Panel    Component Value Date/Time   CHOL 325 (H) 09/25/2022 1620   TRIG 189 (H) 09/25/2022 1620   HDL 47 09/25/2022 1620   CHOLHDL 6.9 (H) 09/25/2022 1620   CHOLHDL 8.9 07/04/2020 0949   VLDL 33 07/04/2020 0949   LDLCALC 241 (H) 09/25/2022 1620     Risk Assessment/Calculations:               Physical Exam:    VS:  BP 102/86   Pulse 82   Ht 5\' 2"  (1.575 m)   Wt 156 lb (70.8 kg)   SpO2 98%   BMI 28.53 kg/m     Wt Readings from Last 3 Encounters:  11/12/22 156 lb (70.8 kg)  09/25/22 157 lb 8 oz (71.4 kg)  03/24/22 157 lb 4.8 oz (71.4 kg)     GEN:  Well nourished, well developed in no acute distress HEENT: Normal NECK: No JVD; No carotid bruits LYMPHATICS: No lymphadenopathy CARDIAC: RRR, no murmurs, rubs, gallops, ? Left subclavian murmur RESPIRATORY:  Clear to auscultation without rales, wheezing or rhonchi  ABDOMEN: Soft, non-tender, non-distended MUSCULOSKELETAL:  No edema; No deformity  SKIN: Warm and dry NEUROLOGIC:  Alert and oriented x 3 PSYCHIATRIC:  Normal affect   ASSESSMENT:    1. Primary hypertension   2. Unequal blood pressure in upper extremities    PLAN:    In order of problems listed  above:  Question left subclavian stenosis-murmur -We will check carotid Dopplers.  Aorta appears normal without coarctation.  Bilateral blood pressures today were normal.  Murmur appreciated around left subclavian region.  No evidence of steal or claudication left arm.  We will check carotid Dopplers specifically looking in the left subclavian region to see if there is any evidence of stenosis.  History of stroke - Right-sided weakness.  Doing well.  Weight loss.  Hypertension - Blood pressure under excellent control currently.  No changes made.  Bifascicular block right bundle branch block -  Continue to monitor.  No high risk symptoms such as syncope.          Medication Adjustments/Labs and Tests Ordered: Current medicines are reviewed at length with the patient today.  Concerns regarding medicines are outlined above.  Orders Placed This Encounter  Procedures   EKG 12-Lead   VAS US CAROTID   No orders of the defined types were placed in this encounter.   Patient Instructions  Medication Instructions:  The current medical regimen is effective;  continue present plan and medications.  *If you need a refill on your cardiac medications before your next appointment, please call your pharmacy*  Testing/Procedures: Your physician has requested that you have a carotid duplex. This test is an ultrasound of the carotid arteries in your neck. It looks at blood flow through these arteries that supply the brain with blood. Allow one hour for this exam. There are no restrictions or special instructions.  Follow-Up: At [facility], you and your health needs are our priority.  As part of our continuing mission to provide you with exceptional heart care, we have created designated Provider Care Teams.  These Care Teams include your primary Cardiologist (physician) and Advanced Practice Providers (APPs -  Physician Assistants and Nurse Practitioners) who all work together to provide you with the care you need, when you need it.  We recommend signing up for the patient portal called "MyChart".  Sign up information is provided on this After Visit Summary.  MyChart is used to connect with patients for Virtual Visits (Telemedicine).  Patients are able to view lab/test results, encounter notes, upcoming appointments, etc.  Non-urgent messages can be sent to your provider as well.   To learn more about what you can do with MyChart, go to [email or web address].    Your next appointment:   6 month(s)  Provider:   Jari Favre, PA-C, Robin Searing, NP, Jacolyn Reedy, PA-C, Eligha Bridegroom, NP, or Tereso Newcomer, PA-C            Signed, Donato Schultz, MD  11/12/2022 5:37 PM    [facility]

## 2022-12-02 ENCOUNTER — Ambulatory Visit (HOSPITAL_COMMUNITY)
Admission: RE | Admit: 2022-12-02 | Discharge: 2022-12-02 | Disposition: A | Discharge status: Home / Self Care | Payer: Medicare Other | Source: Ambulatory Visit | Attending: Cardiology | Admitting: Cardiology

## 2022-12-02 DIAGNOSIS — I10 Essential (primary) hypertension: Secondary | ICD-10-CM | POA: Insufficient documentation

## 2022-12-02 DIAGNOSIS — R0989 Other specified symptoms and signs involving the circulatory and respiratory systems: Secondary | ICD-10-CM | POA: Insufficient documentation

## 2023-01-26 ENCOUNTER — Telehealth: Payer: Self-pay

## 2023-01-26 NOTE — Telephone Encounter (Signed)
Message to be forwarded to [facility]

## 2023-01-26 NOTE — Telephone Encounter (Signed)
Pt daughter is requesting a call back ... She stated that her mom was taking off her diabetes meds .Marland Kitchen But for the past few mornings her Blood Sugar was running high ... Pt was given the first open appt with PCP 8/14

## 2023-01-26 NOTE — Telephone Encounter (Signed)
Call to patient's daughter stated that patient is no longer on Diabetes medications. .  Last 2 days glucose has been in th e 170's.  Patient has not been drinking as much water .  Has had a few more Cards than normal.  Bread and Yougart.  Recently lost her brother.  Daughter concerned as she does not want patient's sugars to continue to rise.  Encouraged to have patient drink more water as she has not for a couple of days.  Did not feel good yesterday.  Feels better today.  Will forward message to

## 2023-01-27 NOTE — Telephone Encounter (Signed)
Called and discussed with Delane, patient's daughter. Thanks, Mikelle!

## 2023-02-11 ENCOUNTER — Encounter: Payer: Medicare Other | Admitting: Internal Medicine

## 2023-02-25 ENCOUNTER — Encounter: Payer: Self-pay | Admitting: Student

## 2023-02-25 ENCOUNTER — Ambulatory Visit: Payer: Medicare Other | Admitting: Student

## 2023-02-25 ENCOUNTER — Ambulatory Visit (INDEPENDENT_AMBULATORY_CARE_PROVIDER_SITE_OTHER): Payer: Medicare Other

## 2023-02-25 ENCOUNTER — Other Ambulatory Visit: Payer: Self-pay

## 2023-02-25 VITALS — BP 118/72 | HR 71 | Temp 98.3°F | Ht 62.0 in | Wt 156.8 lb

## 2023-02-25 VITALS — BP 146/72 | HR 76 | Temp 98.3°F | Ht 62.0 in | Wt 156.8 lb

## 2023-02-25 DIAGNOSIS — E118 Type 2 diabetes mellitus with unspecified complications: Secondary | ICD-10-CM

## 2023-02-25 DIAGNOSIS — I10 Essential (primary) hypertension: Secondary | ICD-10-CM

## 2023-02-25 DIAGNOSIS — Z0000 Encounter for general adult medical examination without abnormal findings: Secondary | ICD-10-CM

## 2023-02-25 DIAGNOSIS — R42 Dizziness and giddiness: Secondary | ICD-10-CM

## 2023-02-25 DIAGNOSIS — E785 Hyperlipidemia, unspecified: Secondary | ICD-10-CM | POA: Diagnosis not present

## 2023-02-25 DIAGNOSIS — E782 Mixed hyperlipidemia: Secondary | ICD-10-CM | POA: Diagnosis not present

## 2023-02-25 LAB — POCT GLYCOSYLATED HEMOGLOBIN (HGB A1C): Hemoglobin A1C: 7.3 % — AB (ref 4.0–5.6)

## 2023-02-25 LAB — GLUCOSE, CAPILLARY: Glucose-Capillary: 148 mg/dL — ABNORMAL HIGH (ref 70–99)

## 2023-02-25 NOTE — Assessment & Plan Note (Addendum)
She says she continues to have lightheadedness once or twice a week. She feels like it is getting worse. It occurs when she is both sitting and walking and does not consistently change with position or exertion.  She says it just feels like she may pass out, but denies that the room is spinning or any other symptoms of vertigo.    Of note, she saw a cardiologist on 5/15 for this concern, but it had resolved prior to that visit. It began again shortly thereafter. Her daughter says she thinks it worsens when the patient is stressed, and notes that there have been two recent family deaths that have caused the patient stress in July.   - Orthostatics were obtained, which were negative - Has not had a recent CBC, BMP so we will get them today

## 2023-02-25 NOTE — Progress Notes (Signed)
HPI:  [name] is a 77 y.o. female living with a history stated below and presents today for an annual wellness visit. Please see problem based assessment and plan for additional details.  Past Medical History:  Diagnosis Date   (HFpEF) heart failure with preserved ejection fraction (HCC)    COVID-19    Diabetes mellitus type 2, diet-controlled (HCC) 11/19/2015   DM (diabetes mellitus) (HCC)    H/O: GI bleed    HLD (hyperlipidemia)    Ischemic cerebrovascular accident (CVA) (HCC)     Current Outpatient Medications on File Prior to Visit  Medication Sig Dispense Refill   AMBULATORY NON FORMULARY MEDICATION Medication Name: Nitroglycerin ointment 0.125% use pea sized amount 4 times a day per rectum  I 30 g 1   aspirin EC 81 MG EC tablet Take 1 tablet (81 mg total) by mouth daily. Swallow whole. 30 tablet 11   atorvastatin (LIPITOR) 80 MG tablet TAKE 1 TABLET(80 MG) BY MOUTH DAILY 90 tablet 0   b complex vitamins tablet Take 1 tablet by mouth daily.     Blood Glucose Monitoring Suppl (ACCU-CHEK GUIDE ME) w/Device KIT Use to test blood glucose 3 times daily.     Cholecalciferol (VITAMIN D PO) Take 1 tablet by mouth daily.     fluticasone (FLONASE) 50 MCG/ACT nasal spray Place 1 spray into both nostrils daily. 32 each 11   glucose blood (ACCU-CHEK GUIDE) test strip Use to test blood glucose 3 times daily.  Dx: E11.9 100 each 6   losartan (COZAAR) 25 MG tablet TAKE 1 TABLET(25 MG) BY MOUTH DAILY 90 tablet 0   Omega-3 Fatty Acids (OMEGA 3 PO) Take 1 capsule by mouth daily.     [DISCONTINUED] metFORMIN (GLUCOPHAGE-XR) 500 MG 24 hr tablet Take 1 tablet (500 mg total) by mouth daily with breakfast. 30 tablet 2   No current facility-administered medications on file prior to visit.   Family History  Problem Relation Age of Onset   Colon cancer Neg Hx    Esophageal cancer Neg Hx    Rectal cancer Neg Hx    Stomach cancer Neg Hx     Social History   Socioeconomic History    Marital status: Widowed    Spouse name: Not on file   Number of children: Not on file   Years of education: Not on file   Highest education level: Not on file  Occupational History   Not on file  Tobacco Use   Smoking status: Never   Smokeless tobacco: Never  Vaping Use   Vaping status: Never Used  Substance and Sexual Activity   Alcohol use: No    Alcohol/week: 0.0 standard drinks of alcohol   Drug use: No   Sexual activity: Not on file  Other Topics Concern   Not on file  Social History Narrative   Not on file   Social Determinants of Health   Financial Resource Strain: Not on file  Food Insecurity: Not on file  Transportation Needs: Not on file  Physical Activity: Not on file  Stress: Not on file  Social Connections: Not on file  Intimate Partner Violence: Not on file   Review of Systems: ROS negative except for what is noted on the assessment and plan.  Vitals:   02/25/23 1515  BP: (!) 146/72  Pulse: 76  Temp: 98.3 F (36.8 C)  TempSrc: Oral  SpO2: 98%  Weight: 156 lb 12.8 oz (71.1 kg)  Height: 5\' 2"  (1.575 m)  Physical Exam: Constitutional: well-appearing, sitting in no acute distress HENT: normocephalic atraumatic, mucous membranes moist Eyes: conjunctiva non-erythematous Cardiovascular: regular rate and rhythm, no m/r/g Pulmonary/Chest: normal work of breathing on room air, lungs clear to auscultation bilaterally Abdominal: soft, non-tender, non-distended MSK: normal bulk and tone Neurological: alert & oriented x 3, no focal deficit Skin: warm and dry Psych: normal mood and behavior  Assessment & Plan:   Patient seen with Dr. Heide Spark  DM II (diabetes mellitus, type II), controlled [facility]) Patient presents today in clinic for follow-up of her type 2 diabetes.  Her last A1c on 3/28 was 7.2.  Hemoglobin A1c today is 7.3. Given her advanced age, she is within her target range for A1c.   - Continue low carbohydrate diet and exercise - Follow-up in  6 months  Hypertension Blood pressure today is 146/72.  Patient has not been taking losartan for a few months, since she has been feeling lightheaded.  She denies any new hypertension-related symptoms including headache, chest pain, shortness of breath, or blurry vision.  Given her lightheadedness, we will hold off on restarting her blood pressure medications for now.  I gave the patient and her daughter a blood pressure log and encouraged them to log the blood pressure and bring it to the return visit so we can have a more accurate blood pressure estimate.  Dizziness She says she continues to have lightheadedness once or twice a week. She feels like it is getting worse. It occurs when she is both sitting and walking and does not consistently change with position or exertion.  She says it just feels like she may pass out, but denies that the room is spinning or any other symptoms of vertigo.    Of note, she saw a cardiologist on 5/15 for this concern, but it had resolved prior to that visit. It began again shortly thereafter. Her daughter says she thinks it worsens when the patient is stressed, and notes that there have been two recent family deaths that have caused the patient stress in July.   - Orthostatics were obtained, which were negative - Has not had a recent CBC, BMP so we will get them today  Hyperlipidemia Previous LDL back in March was 241.  Patient has been taking atorvastatin 80 mg daily since 2022, but stopped a few months earlier this year and restarted on 3/28.  Her previous LDL was 125.  It is unclear why her LDL was so high despite being on a statin.  - Will get a repeat lipid panel today   Annett Fabian, MD  [facility], PGY-1 Phone: [phone] Date 02/25/2023 Time 4:19 PM

## 2023-02-25 NOTE — Assessment & Plan Note (Addendum)
Patient presents today in clinic for follow-up of her type 2 diabetes.  Her last A1c on 3/28 was 7.2.  Hemoglobin A1c today is 7.3. Given her advanced age, she is within her target range for A1c.   - Continue low carbohydrate diet and exercise - Follow-up in 6 months

## 2023-02-25 NOTE — Patient Instructions (Signed)

## 2023-02-25 NOTE — Assessment & Plan Note (Addendum)
Blood pressure today is 146/72.  Patient has not been taking losartan for a few months, since she has been feeling lightheaded.  She denies any new hypertension-related symptoms including headache, chest pain, shortness of breath, or blurry vision.  Given her lightheadedness, we will hold off on restarting her blood pressure medications for now.  I gave the patient and her daughter a blood pressure log and encouraged them to log the blood pressure and bring it to the return visit so we can have a more accurate blood pressure estimate.

## 2023-02-25 NOTE — Progress Notes (Addendum)
Subjective:   Diana Elliott is a 77 y.o. female who presents for an Initial Medicare Annual Wellness Visit.  Visit Complete: In person   Review of Systems    DEFERRED TO PCP  Cardiac Risk Factors include: advanced age (>35men, >61 women);diabetes mellitus;dyslipidemia;hypertension     Objective:    Today's Vitals   02/25/23 1619  BP: 118/72  Pulse: 71  Temp: 98.3 F (36.8 C)  TempSrc: Oral  SpO2: 98%  Weight: 156 lb 12.8 oz (71.1 kg)  Height: 5\' 2"  (1.575 m)  PainSc: 0-No pain   Body mass index is 28.68 kg/m.     02/25/2023    4:24 PM 02/25/2023    3:14 PM 09/25/2022    4:16 PM 09/10/2021    9:22 AM 10/01/2020   10:09 AM 07/27/2020    9:41 AM 07/04/2020    3:30 PM  Advanced Directives  Does Patient Have a Medical Advance Directive? No No No No No No No  Would patient like information on creating a medical advance directive? No - Patient declined No - Patient declined No - Patient declined No - Patient declined No - Patient declined No - Patient declined No - Patient declined    Current Medications (verified) Outpatient Encounter Medications as of 02/25/2023  Medication Sig   AMBULATORY NON FORMULARY MEDICATION Medication Name: Nitroglycerin ointment 0.125% use pea sized amount 4 times a day per rectum  I   aspirin EC 81 MG EC tablet Take 1 tablet (81 mg total) by mouth daily. Swallow whole.   atorvastatin (LIPITOR) 80 MG tablet TAKE 1 TABLET(80 MG) BY MOUTH DAILY   b complex vitamins tablet Take 1 tablet by mouth daily.   Blood Glucose Monitoring Suppl (ACCU-CHEK GUIDE ME) w/Device KIT Use to test blood glucose 3 times daily.   Cholecalciferol (VITAMIN D PO) Take 1 tablet by mouth daily.   glucose blood (ACCU-CHEK GUIDE) test strip Use to test blood glucose 3 times daily.  Dx: E11.9   losartan (COZAAR) 25 MG tablet TAKE 1 TABLET(25 MG) BY MOUTH DAILY   Omega-3 Fatty Acids (OMEGA 3 PO) Take 1 capsule by mouth daily.   fluticasone (FLONASE) 50 MCG/ACT nasal spray  Place 1 spray into both nostrils daily.   [DISCONTINUED] metFORMIN (GLUCOPHAGE-XR) 500 MG 24 hr tablet Take 1 tablet (500 mg total) by mouth daily with breakfast.   No facility-administered encounter medications on file as of 02/25/2023.    Allergies (verified) Patient has no known allergies.   History: Past Medical History:  Diagnosis Date   (HFpEF) heart failure with preserved ejection fraction (HCC)    COVID-19    Diabetes mellitus type 2, diet-controlled (HCC) 11/19/2015   DM (diabetes mellitus) (HCC)    H/O: GI bleed    HLD (hyperlipidemia)    Ischemic cerebrovascular accident (CVA) (HCC)    Past Surgical History:  Procedure Laterality Date   TUBAL LIGATION     Family History  Problem Relation Age of Onset   Colon cancer Neg Hx    Esophageal cancer Neg Hx    Rectal cancer Neg Hx    Stomach cancer Neg Hx    Social History   Socioeconomic History   Marital status: Widowed    Spouse name: Not on file   Number of children: Not on file   Years of education: Not on file   Highest education level: Not on file  Occupational History   Not on file  Tobacco Use   Smoking status: Never  Smokeless tobacco: Never  Vaping Use   Vaping status: Never Used  Substance and Sexual Activity   Alcohol use: No    Alcohol/week: 0.0 standard drinks of alcohol   Drug use: No   Sexual activity: Not on file  Other Topics Concern   Not on file  Social History Narrative   Not on file   Social Determinants of Health   Financial Resource Strain: Low Risk  (02/25/2023)   Overall Financial Resource Strain (CARDIA)    Difficulty of Paying Living Expenses: Not hard at all  Food Insecurity: No Food Insecurity (02/25/2023)   Hunger Vital Sign    Worried About Running Out of Food in the Last Year: Never true    Ran Out of Food in the Last Year: Never true  Transportation Needs: No Transportation Needs (02/25/2023)   PRAPARE - Administrator, Civil Service (Medical): No    Lack  of Transportation (Non-Medical): No  Physical Activity: Insufficiently Active (02/25/2023)   Exercise Vital Sign    Days of Exercise per Week: 2 days    Minutes of Exercise per Session: 20 min  Stress: No Stress Concern Present (02/25/2023)   Diana Elliott    Feeling of Stress : Not at all  Social Connections: Moderately Isolated (02/25/2023)   Social Connection and Isolation Panel [NHANES]    Frequency of Communication with Friends and Family: More than three times a week    Frequency of Social Gatherings with Friends and Family: Once a week    Attends Religious Services: More than 4 times per year    Active Member of [employer] or Organizations: No    Attends Banker Meetings: Never    Marital Status: Widowed    Tobacco Counseling Counseling given: Not Answered   Clinical Intake:  Pre-visit preparation completed: Yes  Pain : No/denies pain Pain Score: 0-No pain     BMI - recorded: 28 Nutritional Status: BMI 25 -29 Overweight Nutritional Risks: None Diabetes: Yes CBG done?: Yes CBG resulted in Enter/ Edit results?: Yes Did pt. bring in CBG monitor from home?: No  How often do you need to have someone help you when you read instructions, pamphlets, or other written materials from your doctor or pharmacy?: 1 - Never What is the last grade level you completed in school?: COLLEGE  Interpreter Needed?: No  Information entered by :: [facility] Shaylynn Nulty   Activities of Daily Living    02/25/2023    4:22 PM 02/25/2023    3:13 PM  In your present state of health, do you have any difficulty performing the following activities:  Hearing? 0 0  Vision? 0 0  Difficulty concentrating or making decisions? 0 0  Walking or climbing stairs? 1 1  Comment  AT TIMES  Dressing or bathing? 0 0  Doing errands, shopping? 1 1  Comment  AT [employer] and eating ? N   Using the Toilet? N   In the past six  months, have you accidently leaked urine? N   Do you have problems with loss of bowel control? N   Managing your Medications? N   Managing your Finances? N   Housekeeping or managing your Housekeeping? N     Patient Care Team: Champ Mungo, DO as PCP - General (Internal Medicine)  Indicate any recent Medical Services you may have received from other than [facility] providers in the past year (date may be approximate).  Assessment:   This is a routine wellness examination for Diana Elliott.  Hearing/Vision screen No results found.  Dietary issues and exercise activities discussed:     Goals Addressed   None   Depression Screen    02/25/2023    3:12 PM 09/25/2022    4:17 PM 09/10/2021    9:22 AM 10/01/2020   10:09 AM 07/27/2020   10:17 AM  PHQ 2/9 Scores  PHQ - 2 Score 0 0 0 0 0  PHQ- 9 Score 0    2    Fall Risk    02/25/2023    4:24 PM 02/25/2023    3:11 PM 09/25/2022    3:31 PM 03/24/2022   11:34 AM 03/24/2022   11:33 AM  Fall Risk   Falls in the past year? 1 1 0  1  Number falls in past yr: 1 1 0  1  Injury with Fall? 0 0 0  0  Risk for fall due to : Impaired balance/gait Impaired balance/gait   Impaired balance/gait  Follow up Falls evaluation completed;Falls prevention discussed Falls evaluation completed  Falls evaluation completed;Education provided     MEDICARE RISK AT HOME: Medicare Risk at Home Any stairs in or around the home?: Yes If so, are there any without handrails?: No Home free of loose throw rugs in walkways, pet beds, electrical cords, etc?: Yes Adequate lighting in your home to reduce risk of falls?: Yes Life alert?: No Use of a cane, walker or w/c?: Yes Grab bars in the bathroom?: Yes Shower chair or bench in shower?: Yes Elevated toilet seat or a handicapped toilet?: Yes  TIMED UP AND GO:  Was the test performed? No    Cognitive Function:        02/25/2023    4:29 PM  6CIT Screen  What Year? 0 points  What month? 0 points  What time? 0  points  Count back from 20 0 points  Months in reverse 0 points  Repeat phrase 0 points  Total Score 0 points    Immunizations Immunization History  Administered Date(s) Administered   Fluad Quad(high Dose 65+) 03/24/2022    TDAP status: Due, Education has been provided regarding the importance of this vaccine. Advised may receive this vaccine at local pharmacy or Health Dept. Aware to provide a copy of the vaccination record if obtained from local pharmacy or Health Dept. Verbalized acceptance and understanding.  Flu Vaccine status: Due, Education has been provided regarding the importance of this vaccine. Advised may receive this vaccine at local pharmacy or Health Dept. Aware to provide a copy of the vaccination record if obtained from local pharmacy or Health Dept. Verbalized acceptance and understanding.  Pneumococcal vaccine status: Due, Education has been provided regarding the importance of this vaccine. Advised may receive this vaccine at local pharmacy or Health Dept. Aware to provide a copy of the vaccination record if obtained from local pharmacy or Health Dept. Verbalized acceptance and understanding.  Covid-19 vaccine status: Information provided on how to obtain vaccines.   Qualifies for Shingles Vaccine? Yes   Zostavax completed No   Shingrix Completed?: No.    Education has been provided regarding the importance of this vaccine. Patient has been advised to call insurance company to determine out of pocket expense if they have not yet received this vaccine. Advised may also receive vaccine at local pharmacy or Health Dept. Verbalized acceptance and understanding.  Screening Tests Health Maintenance  Topic Date Due   OPHTHALMOLOGY EXAM  Never done   Hepatitis C Screening  Never done   DTaP/Tdap/Td (1 - Tdap) Never done   Zoster Vaccines- Shingrix (1 of 2) Never done   Pneumonia Vaccine 43+ Years old (1 of 1 - PCV) Never done   FOOT EXAM  10/01/2021   COVID-19 Vaccine  (1 - 2023-24 season) Never done   Colonoscopy  07/10/2022   Diabetic kidney evaluation - Urine ACR  09/11/2022   INFLUENZA VACCINE  01/29/2023   Diabetic kidney evaluation - eGFR measurement  03/25/2023   HEMOGLOBIN A1C  08/28/2023   Medicare Annual Wellness (AWV)  02/25/2024   DEXA SCAN  Completed   HPV VACCINES  Aged Out    Health Maintenance  Health Maintenance Due  Topic Date Due   OPHTHALMOLOGY EXAM  Never done   Hepatitis C Screening  Never done   DTaP/Tdap/Td (1 - Tdap) Never done   Zoster Vaccines- Shingrix (1 of 2) Never done   Pneumonia Vaccine 47+ Years old (1 of 1 - PCV) Never done   FOOT EXAM  10/01/2021   COVID-19 Vaccine (1 - 2023-24 season) Never done   Colonoscopy  07/10/2022   Diabetic kidney evaluation - Urine ACR  09/11/2022   INFLUENZA VACCINE  01/29/2023   Diabetic kidney evaluation - eGFR measurement  03/25/2023    Colorectal cancer screening: Type of screening: Colonoscopy. Completed 07/11/2019. Repeat every 3 years  Mammogram status: Completed 12/17/2015. Repeat every year  Bone Density status: Completed 12/19/2019. Results reflect: Bone density results: NORMAL. Repeat every 5 years.  Lung Cancer Screening: (Low Dose CT Chest recommended if Age 62-80 years, 20 pack-year currently smoking OR have quit w/in 15years.) does not qualify.   Lung Cancer Screening Referral: DEFERRED TO PCP   Additional Screening:  Hepatitis C Screening:DEFERRED TO PCP   Vision Screening: Recommended annual ophthalmology exams for early detection of glaucoma and other disorders of the eye. Is the patient up to date with their annual eye exam?  Yes  Who is the provider or what is the name of the office in which the patient attends annual eye exams?  DOES NOT  KNOW THE NAME OF HER EYE DR  If pt is not established with a provider, would they like to be referred to a provider to establish care? No .   Dental Screening: Recommended annual dental exams for proper oral  hygiene  Diabetic Foot Exam: Diabetic Foot Exam: Completed 10/01/2021  Community Resource Referral / Chronic Care Management: CRR required this visit?  No   CCM required this visit?  No     Plan:     I have personally reviewed and noted the following in the patient's chart:   Medical and social history Use of alcohol, tobacco or illicit drugs  Current medications and supplements including opioid prescriptions. Patient is not currently taking opioid prescriptions. Functional ability and status Nutritional status Physical activity Advanced directives List of other physicians Hospitalizations, surgeries, and ER visits in previous 12 months Vitals Screenings to include cognitive, depression, and falls Referrals and appointments  In addition, I have reviewed and discussed with patient certain preventive protocols, quality metrics, and best practice recommendations. A written personalized care plan for preventive services as well as general preventive health recommendations were provided to patient.     Derrell Lolling, CMA   02/25/2023   After Visit Summary:  PRINTED  AT THE END OF VISIT   Nurse Notes:  IN PERSON [facility]   Diana Elliott , Thank you  for taking time to come for your Medicare Wellness Visit. I appreciate your ongoing commitment to your health goals. Please review the following plan we discussed and let me know if I can assist you in the future.   These are the goals we discussed:  Goals   None     This is a list of the screening recommended for you and due dates:  Health Maintenance  Topic Date Due   Eye exam for diabetics  Never done   Hepatitis C Screening  Never done   DTaP/Tdap/Td vaccine (1 - Tdap) Never done   Zoster (Shingles) Vaccine (1 of 2) Never done   Pneumonia Vaccine (1 of 1 - PCV) Never done   Complete foot exam   10/01/2021   COVID-19 Vaccine (1 - 2023-24 season) Never done   Colon Cancer Screening  07/10/2022   Yearly kidney health  urinalysis for diabetes  09/11/2022   Flu Shot  01/29/2023   Yearly kidney function blood test for diabetes  03/25/2023   Hemoglobin A1C  08/28/2023   Medicare Annual Wellness Visit  02/25/2024   DEXA scan (bone density measurement)  Completed   HPV Vaccine  Aged Out

## 2023-02-25 NOTE — Assessment & Plan Note (Addendum)
Previous LDL back in March was 241.  Patient has been taking atorvastatin 80 mg daily since 2022, but stopped a few months earlier this year and restarted on 3/28.  Her previous LDL was 125.  It is unclear why her LDL was so high despite being on a statin.  - Will get a repeat lipid panel today

## 2023-02-25 NOTE — Patient Instructions (Signed)
Diana Elliott,  It was great to meet you today.  You had an annual wellness visit at the [facility] health internal medicine clinic.  Your hemoglobin A1c was 7.3, which is within your target range.  You are doing a good job managing your blood sugars with your diet and exercise.  Keep up the good work.  Your blood pressure was a little elevated today at 146/72.  As we discussed, please log your blood pressure at home and bring it to your next visit so we can have a more accurate picture of your blood pressure and whether you need to restart your medications.  For now we will hold off on restarting your medications given your lightheadedness.  For your lightheadedness, I encourage you to continue to drink more water as you may be dehydrated.  We took your orthostatic blood pressure today, which was normal.  We also collected labs including a complete blood count and basic metabolic panel.  We will contact you with the results as soon as we have them.  If you feel that your lightheadedness is worsening or you pass out, please go to the emergency room.  Otherwise, we will plan to follow up with you in 6 months.   In the meantime if you have any questions, you can call us at [phone].  Take care,  Annett Fabian, MD Resident, Internal Medicine [facility]

## 2023-02-27 LAB — LIPID PANEL
Chol/HDL Ratio: 3.4 ratio (ref 0.0–4.4)
Cholesterol, Total: 178 mg/dL (ref 100–199)
HDL: 53 mg/dL (ref >39)
LDL Chol Calc (NIH): 106 mg/dL — ABNORMAL HIGH (ref 0–99)
Triglycerides: 103 mg/dL (ref 0–149)
VLDL Cholesterol Cal: 19 mg/dL (ref 5–40)

## 2023-02-27 LAB — BMP8+ANION GAP
Anion Gap: 12 mmol/L (ref 10.0–18.0)
BUN/Creatinine Ratio: 21 (ref 12–28)
BUN: 18 mg/dL (ref 8–27)
CO2: 25 mmol/L (ref 20–29)
Calcium: 9.5 mg/dL (ref 8.7–10.3)
Chloride: 102 mmol/L (ref 96–106)
Creatinine, Ser: 0.87 mg/dL (ref 0.57–1.00)
Glucose: 124 mg/dL — ABNORMAL HIGH (ref 70–99)
Potassium: 4.1 mmol/L (ref 3.5–5.2)
Sodium: 139 mmol/L (ref 134–144)
eGFR: 69 mL/min/{1.73_m2} (ref >59)

## 2023-02-27 LAB — CBC
Hematocrit: 39 % (ref 34.0–46.6)
Hemoglobin: 12.5 g/dL (ref 11.1–15.9)
MCH: 25 pg — ABNORMAL LOW (ref 26.6–33.0)
MCHC: 32.1 g/dL (ref 31.5–35.7)
MCV: 78 fL — ABNORMAL LOW (ref 79–97)
Platelets: 284 10*3/uL (ref 150–450)
RBC: 5.01 x10E6/uL (ref 3.77–5.28)
RDW: 14 % (ref 11.7–15.4)
WBC: 5.7 10*3/uL (ref 3.4–10.8)

## 2023-03-03 NOTE — Addendum Note (Signed)
Addended by: Earl Lagos on: 03/03/2023 10:22 AM   Modules accepted: Level of Service

## 2023-03-03 NOTE — Progress Notes (Signed)
Internal Medicine Clinic Attending  I was physically present during the key portions of the resident provided service and participated in the medical decision making of patient's management care. I reviewed pertinent patient test results.  The assessment, diagnosis, and plan were formulated together and I agree with the documentation in the resident's note.  Narendra, Nischal, MD  

## 2023-03-09 ENCOUNTER — Inpatient Hospital Stay: Admission: RE | Admit: 2023-03-09 | Payer: Medicare Other | Source: Ambulatory Visit

## 2023-03-10 NOTE — Progress Notes (Signed)
Internal Medicine Clinic Attending  Case and documentation reviewed.  I reviewed the AWV findings.  I agree with the assessment, diagnosis, and plan of care documented in the AWV note.     

## 2023-06-16 ENCOUNTER — Ambulatory Visit (INDEPENDENT_AMBULATORY_CARE_PROVIDER_SITE_OTHER): Payer: Medicare Other | Admitting: Internal Medicine

## 2023-06-16 ENCOUNTER — Other Ambulatory Visit: Payer: Self-pay

## 2023-06-16 ENCOUNTER — Encounter: Payer: Self-pay | Admitting: Internal Medicine

## 2023-06-16 VITALS — BP 119/62 | HR 84 | Temp 97.8°F | Ht 62.0 in | Wt 160.8 lb

## 2023-06-16 DIAGNOSIS — E118 Type 2 diabetes mellitus with unspecified complications: Secondary | ICD-10-CM | POA: Diagnosis not present

## 2023-06-16 DIAGNOSIS — Z23 Encounter for immunization: Secondary | ICD-10-CM | POA: Insufficient documentation

## 2023-06-16 DIAGNOSIS — E785 Hyperlipidemia, unspecified: Secondary | ICD-10-CM | POA: Diagnosis not present

## 2023-06-16 DIAGNOSIS — I10 Essential (primary) hypertension: Secondary | ICD-10-CM

## 2023-06-16 DIAGNOSIS — Z1211 Encounter for screening for malignant neoplasm of colon: Secondary | ICD-10-CM | POA: Insufficient documentation

## 2023-06-16 DIAGNOSIS — E782 Mixed hyperlipidemia: Secondary | ICD-10-CM

## 2023-06-16 NOTE — Assessment & Plan Note (Signed)
Lipid profile last checked 01/2023, LDL above goal at 106 however this has been drastically improving over the last 1 year. OP regimen is atorvastatin 80 mg daily, she also takes omega-3 fatty acids. Plan:Continue atorvastatin, omega-3 fatty acids. Lipid panel at next OV; may need to add zetia to reach goal LDL <70.

## 2023-06-16 NOTE — Patient Instructions (Signed)
It as a pleasure to care for you today!  I will let you know if your test results are abnormal.  I have placed a referral to GI to discuss your colonoscopy needs.  Don't forget to get your annual diabetic eye exam!  Please follow up in 6 months, or sooner if needed.  My best, Dr. August Saucer

## 2023-06-16 NOTE — Assessment & Plan Note (Signed)
Managed without medications. HbA1c 7.3% 01/2023 which has been stable compared to prior checks.  Plan: HbA1c at next f/u visit. Will assess for proteinuria today. Foot exam complete. Emphasis on annual diabetic eye exam given.

## 2023-06-16 NOTE — Assessment & Plan Note (Signed)
Flu shot given today. Discussed pneumonia vaccine, which she will consider at her next visit. Tetanus vaccine given within the last 10 years.

## 2023-06-16 NOTE — Assessment & Plan Note (Signed)
BP 119/62. OP regimen is losartan 25 mg daily which she is compliant with. Renal function WNL when last checked 01/2023. Plan:Continue losartan 25 mg daily.

## 2023-06-16 NOTE — Assessment & Plan Note (Signed)
Recommended 3-year repeat colonoscopy after procedure in 06/2022. Plan:Referral to GI for colonoscopy placed.

## 2023-06-16 NOTE — Progress Notes (Signed)
   CC: routine check up  HPI:  Diana Elliott is a 77 y.o. female with past medical history as detailed below who presents today for regular check up. Please see problem based charting for detailed assessment and plan.  Past Medical History:  Diagnosis Date   (HFpEF) heart failure with preserved ejection fraction (HCC)    COVID-19    Diabetes mellitus type 2, diet-controlled (HCC) 11/19/2015   DM (diabetes mellitus) (HCC)    H/O: GI bleed    H/O: GI bleed 07/29/2020   HLD (hyperlipidemia)    Ischemic cerebrovascular accident (CVA) (HCC)    Review of Systems:  Negative unless otherwise stated.  Physical Exam:  Vitals:   06/16/23 1440  BP: 119/62  Pulse: 84  Temp: 97.8 F (36.6 C)  TempSrc: Oral  SpO2: 100%  Weight: 160 lb 12.8 oz (72.9 kg)  Height: 5\' 2"  (1.575 m)   Constitutional:Appears well, stated age, well groomed and cared for. In no acute distress. Cardio:Regular rate and rhythm. No murmurs, rubs, or gallops. Pulm:Clear to auscultation bilaterally. Normal work of breathing on room air. Abdomen:Soft, nontender, nondistended. ZOX:WRUEAVWU for extremity edema. Skin:Warm and dry. Neuro:Alert and oriented x3. No focal deficit noted. Psych:Pleasant mood and affect.  Assessment & Plan:   See Encounters Tab for problem based charting.  Hypertension BP 119/62. OP regimen is losartan 25 mg daily which she is compliant with. Renal function WNL when last checked 01/2023. Plan:Continue losartan 25 mg daily.  DM II (diabetes mellitus, type II), controlled (HCC) Managed without medications. HbA1c 7.3% 01/2023 which has been stable compared to prior checks.  Plan: HbA1c at next f/u visit. Will assess for proteinuria today. Foot exam complete. Emphasis on annual diabetic eye exam given.  Encounter for immunization Flu shot given today. Discussed pneumonia vaccine, which she will consider at her next visit. Tetanus vaccine given within the last 10  years.  Hyperlipidemia Lipid profile last checked 01/2023, LDL above goal at 106 however this has been drastically improving over the last 1 year. OP regimen is atorvastatin 80 mg daily, she also takes omega-3 fatty acids. Plan:Continue atorvastatin, omega-3 fatty acids. Lipid panel at next OV; may need to add zetia to reach goal LDL <70.  Screening for colon cancer Recommended 3-year repeat colonoscopy after procedure in 06/2022. Plan:Referral to GI for colonoscopy placed.  Patient discussed with Dr.  Lafonda Mosses

## 2023-06-17 LAB — MICROALBUMIN / CREATININE URINE RATIO
Creatinine, Urine: 148.1 mg/dL
Microalb/Creat Ratio: 16 mg/g{creat} (ref 0–29)
Microalbumin, Urine: 24 ug/mL

## 2023-06-18 ENCOUNTER — Encounter: Payer: Self-pay | Admitting: Internal Medicine

## 2023-06-18 NOTE — Progress Notes (Signed)
[facility] Attending  Case discussed with the resident at the time of the visit.  We reviewed the resident's history and exam and pertinent patient test results.  I agree with the assessment, diagnosis, and plan of care documented in the resident's note.  

## 2023-08-03 ENCOUNTER — Other Ambulatory Visit (HOSPITAL_COMMUNITY): Payer: Self-pay | Admitting: Obstetrics & Gynecology

## 2023-08-03 DIAGNOSIS — Z780 Asymptomatic menopausal state: Secondary | ICD-10-CM

## 2023-08-10 ENCOUNTER — Other Ambulatory Visit: Payer: Self-pay

## 2023-08-10 DIAGNOSIS — E785 Hyperlipidemia, unspecified: Secondary | ICD-10-CM

## 2023-08-10 MED ORDER — ATORVASTATIN CALCIUM 80 MG PO TABS: ORAL_TABLET | ORAL | 0 refills | Status: DC

## 2023-08-26 DIAGNOSIS — Z1231 Encounter for screening mammogram for malignant neoplasm of breast: Secondary | ICD-10-CM | POA: Diagnosis not present

## 2023-08-27 ENCOUNTER — Other Ambulatory Visit: Payer: Self-pay | Admitting: Obstetrics & Gynecology

## 2023-08-27 DIAGNOSIS — N951 Menopausal and female climacteric states: Secondary | ICD-10-CM

## 2023-09-14 ENCOUNTER — Encounter: Payer: Self-pay | Admitting: Internal Medicine

## 2023-11-07 ENCOUNTER — Other Ambulatory Visit: Payer: Self-pay | Admitting: Internal Medicine

## 2023-11-07 DIAGNOSIS — E785 Hyperlipidemia, unspecified: Secondary | ICD-10-CM

## 2023-11-09 NOTE — Telephone Encounter (Signed)
 Medication sent to pharmacy

## 2024-02-15 ENCOUNTER — Other Ambulatory Visit: Payer: Self-pay

## 2024-02-15 DIAGNOSIS — E785 Hyperlipidemia, unspecified: Secondary | ICD-10-CM

## 2024-02-15 NOTE — Telephone Encounter (Signed)
 Patient last seen 06/16/23 I called the patient to schedule a follow up appointment.  Unable to reach her, I lvm for her to give us  a call back.

## 2024-02-17 MED ORDER — ATORVASTATIN CALCIUM 80 MG PO TABS: ORAL_TABLET | ORAL | 0 refills | Status: AC

## 2024-04-26 ENCOUNTER — Other Ambulatory Visit (HOSPITAL_BASED_OUTPATIENT_CLINIC_OR_DEPARTMENT_OTHER): Payer: Self-pay | Admitting: Obstetrics & Gynecology

## 2024-04-26 DIAGNOSIS — N951 Menopausal and female climacteric states: Secondary | ICD-10-CM

## 2024-05-12 ENCOUNTER — Other Ambulatory Visit

## 2024-06-29 NOTE — Progress Notes (Signed)
 Kieli Golladay                                          MRN: [id number]   06/29/2024   The [facility] Quality Team Specialist reviewed this patient medical record for the purposes of chart review for care gap closure. The following were reviewed: chart review for care gap closure-kidney health evaluation for diabetes:eGFR  and uACR.    [facility] Quality Team
